# Patient Record
Sex: Male | Born: 1985
Health system: Southern US, Community
[De-identification: ages and names within clinical notes are randomized; demographics above are authoritative.]

---

## 2017-03-14 ENCOUNTER — Ambulatory Visit (HOSPITAL_COMMUNITY)
Admission: EM | Admit: 2017-03-14 | Discharge: 2017-03-14 | Disposition: A | Discharge status: Home / Self Care | Payer: Self-pay | Attending: Emergency Medicine | Admitting: Emergency Medicine

## 2017-03-14 ENCOUNTER — Encounter (HOSPITAL_COMMUNITY): Payer: Self-pay | Admitting: Family Medicine

## 2017-03-14 ENCOUNTER — Ambulatory Visit (INDEPENDENT_AMBULATORY_CARE_PROVIDER_SITE_OTHER): Payer: Self-pay

## 2017-03-14 DIAGNOSIS — S5002XA Contusion of left elbow, initial encounter: Secondary | ICD-10-CM

## 2017-03-14 MED ORDER — NAPROXEN 500 MG PO TABS: 500.0000 mg | ORAL_TABLET | Freq: Two times a day (BID) | ORAL | 0 refills | Status: DC

## 2017-03-14 NOTE — ED Provider Notes (Signed)
CSN: [id number]     Arrival date & time 03/14/17  1002 History   First MD Initiated Contact with Patient 03/14/17 1034     Chief Complaint  Patient presents with  . Arm Injury   (Consider location/radiation/quality/duration/timing/severity/associated sxs/prior Treatment) Larey Seat 2 sdays ago and landed on his lt elbow. States that he has not been able to flex or extend it fully. Denies any shoulder pain . Is able to lift arm to put shirt on. Denies any previous injury to elbow but has had a dislocation to the lt upper shoulder before. Denies any numbness or tingling to digits. Has not taken anything pta.        History reviewed. No pertinent past medical history. History reviewed. No pertinent surgical history. History reviewed. No pertinent family history. Social History  Substance Use Topics  . Smoking status: Never Smoker  . Smokeless tobacco: Never Used  . Alcohol use Not on file    Review of Systems  Constitutional: Negative.   Respiratory: Negative.   Cardiovascular: Negative.   Musculoskeletal: Positive for joint swelling.       Lt elbow pain and swelling around the joint.   Skin: Negative.   Neurological: Negative.     Allergies  Patient has no known allergies.  Home Medications   Prior to Admission medications   Medication Sig Start Date End Date Taking? Authorizing Provider  naproxen (NAPROSYN) 500 MG tablet Take 1 tablet (500 mg total) by mouth 2 (two) times daily. 03/14/17   Tobi Bastos, NP   Meds Ordered and Administered this Visit  Medications - No data to display  BP 133/60   Pulse 60   Temp 98.3 F (36.8 C)   Resp 18   SpO2 100%  No data found.   Physical Exam  Constitutional: He appears well-developed.  Cardiovascular: Normal rate and regular rhythm.   Pulmonary/Chest: Effort normal and breath sounds normal.  Musculoskeletal: He exhibits edema and tenderness.  Lt elbow pain with flexion and extension at the joint anterior . Strong pulse,  warm and pink,  Skin: Skin is warm. Capillary refill takes less than 2 seconds.    Urgent Care Course     Procedures (including critical care time)  Labs Review Labs Reviewed - No data to display  Imaging Review Dg Elbow Complete Left  Result Date: 03/14/2017 CLINICAL DATA:  Elbow injury with pain. EXAM: LEFT ELBOW - COMPLETE 3+ VIEW COMPARISON:  None. FINDINGS: There is no evidence of fracture, dislocation, or joint effusion. There is no evidence of arthropathy or other focal bone abnormality. Soft tissues are unremarkable. IMPRESSION: Negative. Electronically Signed   By: Kennith Center M.D.   On: 03/14/2017 10:56             MDM   1. Contusion of left elbow, initial encounter    Xray does not show a fracture. Offered to x ray shoulder and pt did not want to at this time. Expressed that he may need to have an MRI or further testing if area does not get better In 1 week Referred to ortho  Take meds as needed. May use ice and heat     Tobi Bastos, NP 03/14/17 1126

## 2017-03-14 NOTE — ED Triage Notes (Signed)
Pt here for left elbow pain after a fall on Tuesday.

## 2018-12-07 ENCOUNTER — Ambulatory Visit (INDEPENDENT_AMBULATORY_CARE_PROVIDER_SITE_OTHER): Payer: Self-pay | Admitting: Family Medicine

## 2018-12-07 ENCOUNTER — Encounter: Payer: Self-pay | Admitting: Family Medicine

## 2018-12-07 VITALS — BP 124/82 | HR 62 | Resp 17 | Ht 66.0 in | Wt 150.0 lb

## 2018-12-07 DIAGNOSIS — Z0000 Encounter for general adult medical examination without abnormal findings: Secondary | ICD-10-CM

## 2018-12-07 DIAGNOSIS — Z13220 Encounter for screening for lipoid disorders: Secondary | ICD-10-CM

## 2018-12-07 DIAGNOSIS — Z23 Encounter for immunization: Secondary | ICD-10-CM

## 2018-12-07 DIAGNOSIS — Z114 Encounter for screening for human immunodeficiency virus [HIV]: Secondary | ICD-10-CM

## 2018-12-07 DIAGNOSIS — Z131 Encounter for screening for diabetes mellitus: Secondary | ICD-10-CM

## 2018-12-07 DIAGNOSIS — R6882 Decreased libido: Secondary | ICD-10-CM

## 2018-12-07 DIAGNOSIS — R5383 Other fatigue: Secondary | ICD-10-CM

## 2018-12-07 DIAGNOSIS — Z7689 Persons encountering health services in other specified circumstances: Secondary | ICD-10-CM

## 2018-12-07 LAB — POCT URINALYSIS DIP (CLINITEK)
BILIRUBIN UA: NEGATIVE
GLUCOSE UA: NEGATIVE mg/dL
Ketones, POC UA: NEGATIVE mg/dL
Leukocytes, UA: NEGATIVE
Nitrite, UA: NEGATIVE
PH UA: 6 (ref 5.0–8.0)
POC PROTEIN,UA: NEGATIVE
RBC UA: NEGATIVE
SPEC GRAV UA: 1.02 (ref 1.010–1.025)
UROBILINOGEN UA: 0.2 U/dL

## 2018-12-07 NOTE — Progress Notes (Signed)
Patient ID: Manuel Barnes, male    DOB: 1986/05/20, 33 y.o.   MRN: [id number]  PCP: Bing Neighbors, FNP  Chief Complaint  Patient presents with  . Establish Care  . Hyperlipidemia    is physicallly active(plays soccer & works out at home). diet is mostly vegan/plant based. eats meat occasionally    Subjective:  HPI Manuel Barnes is a 33 y.o. male, nonsmoker presents for complete physical exam.  Concern that since he's turned 33 years old over the last 3 years he has become more "weaker" when playing sports or with physically pushing his endurance level. He also concerned that he has a testosterone deficiency as he has had difficulty maintaining erections occasionally.  He reports mentally being interested in having sex however during a couple episodes he has been unable to achieve an erection.  This concerns him greatly as he is worried that something more organic is the cause.  Is physically active.  He does not use alcohol, smoke, or use illicit drugs.  He endorses is a mostly plant-based vegan type diet she occasionally diverts from if these types of foods are not readily available.  He endorses stress related to work.  Medical history is significant for father and paternal uncle both with diabetes.  His mother has borderline diabetes however is not currently taking any medication managing through lifestyle changes.  Eyes any known family history of cardiovascular disease or lung disease and/or cancers.  Chronic conditions include: There are no active problems to display for this patient.   Current home medications include: Prior to Admission medications   Not on File    Health Promotion: Health Screening Current/Overdue:   Immunizations overdue for Tdap No routine eye or dental care.  Although he endorses he is supposed to wear corrective lenses.  Family History  Problem Relation Age of Onset  . Diabetes Father   . Hyperlipidemia Father   . Diabetes  Paternal Uncle   . Hyperlipidemia Paternal Uncle   . Diabetes Paternal Grandmother   . Hyperlipidemia Paternal Grandmother   . Diabetes Paternal Grandfather   . Hyperlipidemia Paternal Grandfather   . Stroke Neg Hx      No Known Allergies  Social History   Socioeconomic History  . Marital status: Single    Spouse name: Not on file  . Number of children: Not on file  . Years of education: Not on file  . Highest education level: Not on file  Occupational History  . Not on file  Social Needs  . Financial resource strain: Not on file  . Food insecurity:    Worry: Not on file    Inability: Not on file  . Transportation needs:    Medical: Not on file    Non-medical: Not on file  Tobacco Use  . Smoking status: Never Smoker  . Smokeless tobacco: Never Used  Substance and Sexual Activity  . Alcohol use: Not on file  . Drug use: Not on file  . Sexual activity: Not on file  Lifestyle  . Physical activity:    Days per week: Not on file    Minutes per session: Not on file  . Stress: Not on file  Relationships  . Social connections:    Talks on phone: Not on file    Gets together: Not on file    Attends religious service: Not on file    Active member of club or organization: Not on file    Attends meetings of clubs  or organizations: Not on file    Relationship status: Not on file  . Intimate partner violence:    Fear of current or ex partner: Not on file    Emotionally abused: Not on file    Physically abused: Not on file    Forced sexual activity: Not on file  Other Topics Concern  . Not on file  Social History Narrative  . Not on file   Review of Systems Pertinent negatives listed in HPI Past Medical, Surgical Family and Social History reviewed and updated.  Objective:   Today's Vitals   12/07/18 1112  BP: 124/82  Pulse: 62  Resp: 17  SpO2: 97%  Weight: 150 lb (68 kg)  Height: 5\' 6"  (1.676 m)    Wt Readings from Last 3 Encounters:  12/07/18 150 lb (68  kg)   Physical Exam            Assessment & Plan:  1. Encounter to establish care 2. Lipid screening - Lipid Panel  3. Fatigue, unspecified type Checking the following lab: - CBC with Differential - Iron, TIBC and Ferritin Panel - Thyroid Panel With TSH  4. Low libido - POCT URINALYSIS DIP (CLINITEK) - Testosterone Free, Profile I  5. Screening for diabetes mellitus - Comprehensive metabolic panel - Hemoglobin A1c  6. Screening for HIV (human immunodeficiency virus) - HIV antibody (with reflex)  7. Need for Tdap vaccination TDAP given   8. Annual physical exam Age-appropriate anticipatory guidance provided.    Orders Placed This Encounter  Procedures  . Tdap vaccine greater than or equal to 7yo IM  . Lipid Panel    Order Specific Question:   Has the patient fasted?    Answer:   Yes  . Testosterone Free, Profile I  . Comprehensive metabolic panel    Order Specific Question:   Has the patient fasted?    Answer:   Yes  . CBC with Differential  . Iron, TIBC and Ferritin Panel  . Hemoglobin A1c  . Thyroid Panel With TSH  . HIV antibody (with reflex)  . POCT URINALYSIS DIP (CLINITEK)   Joaquin CourtsKimberly Jhada Risk, FNP [facility] [address], [place] [id number] [phone]: [phone]

## 2018-12-07 NOTE — Patient Instructions (Addendum)
Thank you for choosing Primary Care at [facility] to be your medical home!    Manuel Barnes was seen by Molli Barrows, FNP today.   Margaretmary Dys Gonzalez's primary care provider is Scot Jun, FNP.   For the best care possible, you should try to see Molli Barrows, FNP-C whenever you come to the clinic.   We look forward to seeing you again soon!  If you have any questions about your visit today, please call us at [phone] or feel free to reach your primary care provider via [facility].     Preventive Care 18-39 Years, Male Preventive care refers to lifestyle choices and visits with your health care provider that can promote health and wellness. What does preventive care include?   A yearly physical exam. This is also called an annual well check.  Dental exams once or twice a year.  Routine eye exams. Ask your health care provider how often you should have your eyes checked.  Personal lifestyle choices, including: ? Daily care of your teeth and gums. ? Regular physical activity. ? Eating a healthy diet. ? Avoiding tobacco and drug use. ? Limiting alcohol use. ? Practicing safe sex. What happens during an annual well check? The services and screenings done by your health care provider during your annual well check will depend on your age, overall health, lifestyle risk factors, and family history of disease. Counseling Your health care provider may ask you questions about your:  Alcohol use.  Tobacco use.  Drug use.  Emotional well-being.  Home and relationship well-being.  Sexual activity.  Eating habits.  Work and work Statistician. Screening You may have the following tests or measurements:  Height, weight, and BMI.  Blood pressure.  Lipid and cholesterol levels. These may be checked every 5 years starting at age 71.  Diabetes screening. This is done by checking your blood sugar (glucose) after you have not eaten for a while  (fasting).  Skin check.  Hepatitis C blood test.  Hepatitis B blood test.  Sexually transmitted disease (STD) testing. Discuss your test results, treatment options, and if necessary, the need for more tests with your health care provider. Vaccines Your health care provider may recommend certain vaccines, such as:  Influenza vaccine. This is recommended every year.  Tetanus, diphtheria, and acellular pertussis (Tdap, Td) vaccine. You may need a Td booster every 10 years.  Varicella vaccine. You may need this if you have not been vaccinated.  HPV vaccine. If you are 34 or younger, you may need three doses over 6 months.  Measles, mumps, and rubella (MMR) vaccine. You may need at least one dose of MMR.You may also need a second dose.  Pneumococcal 13-valent conjugate (PCV13) vaccine. You may need this if you have certain conditions and have not been vaccinated.  Pneumococcal polysaccharide (PPSV23) vaccine. You may need one or two doses if you smoke cigarettes or if you have certain conditions.  Meningococcal vaccine. One dose is recommended if you are age 53-21 years and a first-year college student living in a residence hall, or if you have one of several medical conditions. You may also need additional booster doses.  Hepatitis A vaccine. You may need this if you have certain conditions or if you travel or work in places where you may be exposed to hepatitis A.  Hepatitis B vaccine. You may need this if you have certain conditions or if you travel or work in places where you may be exposed to  hepatitis B.  Haemophilus influenzae type b (Hib) vaccine. You may need this if you have certain risk factors. Talk to your health care provider about which screenings and vaccines you need and how often you need them. This information is not intended to replace advice given to you by your health care provider. Make sure you discuss any questions you have with your health care  provider. Document Released: 12/31/2001 Document Revised: 06/17/2017 Document Reviewed: 09/05/2015 Elsevier Interactive Patient Education  2019 [employer].    Erectile Dysfunction Erectile dysfunction (ED) is the inability to get or keep an erection in order to have sexual intercourse. Erectile dysfunction may include:  Inability to get an erection.  Lack of enough hardness of the erection to allow penetration.  Loss of the erection before sex is finished. What are the causes? This condition may be caused by:  Certain medicines, such as: ? Pain relievers. ? Antihistamines. ? Antidepressants. ? Blood pressure medicines. ? Water pills (diuretics). ? Ulcer medicines. ? Muscle relaxants. ? Drugs.  Excessive drinking.  Psychological causes, such as: ? Anxiety. ? Depression. ? Sadness. ? Exhaustion. ? Performance fear. ? Stress.  Physical causes, such as: ? Artery problems. This may include diabetes, smoking, liver disease, or atherosclerosis. ? High blood pressure. ? Hormonal problems, such as low testosterone. ? Obesity. ? Nerve problems. This may include back or pelvic injuries, diabetes mellitus, multiple sclerosis, or Parkinson disease. What are the signs or symptoms? Symptoms of this condition include:  Inability to get an erection.  Lack of enough hardness of the erection to allow penetration.  Loss of the erection before sex is finished.  Normal erections at some times, but with frequent unsatisfactory episodes.  Low sexual satisfaction in either partner due to erection problems.  A curved penis occurring with erection. The curve may cause pain or the penis may be too curved to allow for intercourse.  Never having nighttime erections. How is this diagnosed? This condition is often diagnosed by:  Performing a physical exam to find other diseases or specific problems with the penis.  Asking you detailed questions about the problem.  Performing  blood tests to check for diabetes mellitus or to measure hormone levels.  Performing other tests to check for underlying health conditions.  Performing an ultrasound exam to check for scarring.  Performing a test to check blood flow to the penis.  Doing a sleep study at home to measure nighttime erections. How is this treated? This condition may be treated by:  Medicine taken by mouth to help you achieve an erection (oral medicine).  Hormone replacement therapy to replace low testosterone levels.  Medicine that is injected into the penis. Your health care provider may instruct you how to give yourself these injections at home.  Vacuum pump. This is a pump with a ring on it. The pump and ring are placed on the penis and used to create pressure that helps the penis become erect.  Penile implant surgery. In this procedure, you may receive: ? An inflatable implant. This consists of cylinders, a pump, and a reservoir. The cylinders can be inflated with a fluid that helps to create an erection, and they can be deflated after intercourse. ? A semi-rigid implant. This consists of two silicone rubber rods. The rods provide some rigidity. They are also flexible, so the penis can both curve downward in its normal position and become straight for sexual intercourse.  Blood vessel surgery, to improve blood flow to the penis. During  this procedure, a blood vessel from a different part of the body is placed into the penis to allow blood to flow around (bypass) damaged or blocked blood vessels.  Lifestyle changes, such as exercising more, losing weight, and quitting smoking. Follow these instructions at home: Medicines   Take over-the-counter and prescription medicines only as told by your health care provider. Do not increase the dosage without first discussing it with your health care provider.  If you are using self-injections, perform injections as directed by your health care provider. Make  sure to avoid any veins that are on the surface of the penis. After giving an injection, apply pressure to the injection site for 5 minutes. General instructions  Exercise regularly, as directed by your health care provider. Work with your health care provider to lose weight, if needed.  Do not use any products that contain nicotine or tobacco, such as cigarettes and e-cigarettes. If you need help quitting, ask your health care provider.  Before using a vacuum pump, read the instructions that come with the pump and discuss any questions with your health care provider.  Keep all follow-up visits as told by your health care provider. This is important. Contact a health care provider if:  You feel nauseous.  You vomit. Get help right away if:  You are taking oral or injectable medicines and you have an erection that lasts longer than 4 hours. If your health care provider is unavailable, go to the nearest emergency room for evaluation. An erection that lasts much longer than 4 hours can result in permanent damage to your penis.  You have severe pain in your groin or abdomen.  You develop redness or severe swelling of your penis.  You have redness spreading up into your groin or lower abdomen.  You are unable to urinate.  You experience chest pain or a rapid heart beat (palpitations) after taking oral medicines. Summary  Erectile dysfunction (ED) is the inability to get or keep an erection during sexual intercourse. This problem can usually be treated successfully.  This condition is diagnosed based on a physical exam, your symptoms, and tests to determine the cause. Treatment varies depending on the cause, and may include medicines, hormone therapy, surgery, or vacuum pump.  You may need follow-up visits to make sure that you are using your medicines or devices correctly.  Get help right away if you are taking or injecting medicines and you have an erection that lasts longer than 4  hours. This information is not intended to replace advice given to you by your health care provider. Make sure you discuss any questions you have with your health care provider. Document Released: 11/01/2000 Document Revised: 11/20/2016 Document Reviewed: 11/20/2016 Elsevier Interactive Patient Education  2019 [employer].

## 2018-12-08 LAB — IRON,TIBC AND FERRITIN PANEL
FERRITIN: 85 ng/mL (ref 30–400)
Iron Saturation: 30 % (ref 15–55)
Iron: 95 ug/dL (ref 38–169)
TIBC: 318 ug/dL (ref 250–450)
UIBC: 223 ug/dL (ref 111–343)

## 2018-12-09 ENCOUNTER — Telehealth: Payer: Self-pay | Admitting: Family Medicine

## 2018-12-09 NOTE — Telephone Encounter (Signed)
Called [facility]. They did receive the specimens. The ferritin was on a separate requisition & that's why those results are in the system. The other labs have been resulted except for the Testosterone level. Requested that they fax the results that they have in the meantime.

## 2018-12-09 NOTE — Telephone Encounter (Signed)
This person's labs never resulted. Could you follow-up with [facility]?  Thanks,  Joaquin CourtsKimberly Dragan Tamburrino, FNP

## 2018-12-10 LAB — CBC WITH DIFFERENTIAL/PLATELET
BASOS ABS: 0.1 10*3/uL (ref 0.0–0.2)
Basos: 1 %
EOS (ABSOLUTE): 0.4 10*3/uL (ref 0.0–0.4)
Eos: 7 %
Hematocrit: 44.7 % (ref 37.5–51.0)
Hemoglobin: 15.6 g/dL (ref 13.0–17.7)
Immature Grans (Abs): 0 10*3/uL (ref 0.0–0.1)
Immature Granulocytes: 0 %
LYMPHS ABS: 1.3 10*3/uL (ref 0.7–3.1)
LYMPHS: 26 %
MCH: 31 pg (ref 26.6–33.0)
MCHC: 34.9 g/dL (ref 31.5–35.7)
MCV: 89 fL (ref 79–97)
MONOS ABS: 0.4 10*3/uL (ref 0.1–0.9)
Monocytes: 7 %
NEUTROS ABS: 3 10*3/uL (ref 1.4–7.0)
Neutrophils: 59 %
PLATELETS: 181 10*3/uL (ref 150–450)
RBC: 5.03 x10E6/uL (ref 4.14–5.80)
RDW: 12.5 % (ref 11.6–15.4)
WBC: 5.1 10*3/uL (ref 3.4–10.8)

## 2018-12-10 LAB — HIV ANTIBODY (ROUTINE TESTING W REFLEX): HIV SCREEN 4TH GENERATION: NONREACTIVE

## 2018-12-10 LAB — HEMOGLOBIN A1C
Est. average glucose Bld gHb Est-mCnc: 100 mg/dL
HEMOGLOBIN A1C: 5.1 % (ref 4.8–5.6)

## 2018-12-10 LAB — COMPREHENSIVE METABOLIC PANEL
A/G RATIO: 2.3 — AB (ref 1.2–2.2)
ALT: 11 IU/L (ref 0–44)
AST: 8 IU/L (ref 0–40)
Albumin: 4.9 g/dL (ref 4.0–5.0)
Alkaline Phosphatase: 64 IU/L (ref 39–117)
BILIRUBIN TOTAL: 0.5 mg/dL (ref 0.0–1.2)
BUN/Creatinine Ratio: 19 (ref 9–20)
BUN: 16 mg/dL (ref 6–20)
CHLORIDE: 106 mmol/L (ref 96–106)
CO2: 23 mmol/L (ref 20–29)
Calcium: 10 mg/dL (ref 8.7–10.2)
Creatinine, Ser: 0.86 mg/dL (ref 0.76–1.27)
GFR calc non Af Amer: 114 mL/min/{1.73_m2} (ref >59)
GFR, EST AFRICAN AMERICAN: 132 mL/min/{1.73_m2} (ref >59)
GLUCOSE: 95 mg/dL (ref 65–99)
Globulin, Total: 2.1 g/dL (ref 1.5–4.5)
POTASSIUM: 5.4 mmol/L — AB (ref 3.5–5.2)
SODIUM: 144 mmol/L (ref 134–144)
TOTAL PROTEIN: 7 g/dL (ref 6.0–8.5)

## 2018-12-10 LAB — TESTOSTERONE FREE, PROFILE I
Sex Hormone Binding: 68.1 nmol/L — ABNORMAL HIGH (ref 16.5–55.9)
TESTOSTERONE: 724 ng/dL (ref 264–916)
Testost., Free, Calc: 92.7 pg/mL (ref 42.3–190.0)

## 2018-12-10 LAB — LIPID PANEL
CHOL/HDL RATIO: 2.7 ratio (ref 0.0–5.0)
Cholesterol, Total: 183 mg/dL (ref 100–199)
HDL: 68 mg/dL (ref >39)
LDL CALC: 102 mg/dL — AB (ref 0–99)
Triglycerides: 65 mg/dL (ref 0–149)
VLDL CHOLESTEROL CAL: 13 mg/dL (ref 5–40)

## 2018-12-10 LAB — THYROID PANEL WITH TSH
Free Thyroxine Index: 1.9 (ref 1.2–4.9)
T3 Uptake Ratio: 28 % (ref 24–39)
T4, Total: 6.7 ug/dL (ref 4.5–12.0)
TSH: 2.57 u[IU]/mL (ref 0.450–4.500)

## 2018-12-16 NOTE — Progress Notes (Signed)
Patient notified of results & recommendations. Expressed understanding. Will call back to make a lab appointment once he knows what his work schedule is like.

## 2019-04-22 ENCOUNTER — Other Ambulatory Visit: Payer: Self-pay

## 2019-04-22 ENCOUNTER — Ambulatory Visit (INDEPENDENT_AMBULATORY_CARE_PROVIDER_SITE_OTHER): Payer: Self-pay | Admitting: Family Medicine

## 2019-04-22 ENCOUNTER — Encounter: Payer: Self-pay | Admitting: Family Medicine

## 2019-04-22 DIAGNOSIS — Z7689 Persons encountering health services in other specified circumstances: Secondary | ICD-10-CM

## 2019-04-22 DIAGNOSIS — R6882 Decreased libido: Secondary | ICD-10-CM

## 2019-04-22 DIAGNOSIS — R3989 Other symptoms and signs involving the genitourinary system: Secondary | ICD-10-CM

## 2019-04-22 NOTE — Progress Notes (Signed)
Virtual Visit via Telephone Note  I connected with Manuel Barnes on 04/22/19 at  2:50 PM EDT by telephone and verified that I am speaking with the correct person using two identifiers.  Location: Patient: Located at home during today's encounter  Provider: Located at primary care office    I discussed the limitations, risks, security and privacy concerns of performing an evaluation and management service by telephone and the availability of in person appointments. I also discussed with the patient that there may be a patient responsible charge related to this service. The patient expressed understanding and agreed to proceed.  History of Present Illness: Manuel Barnes is requesting a referral to urology. During his last visit he was concern that his libido had decreased compared to a couple year prior. He is with the same sexual partner. During the last office visit, a testosterone level was measured and did not reveal low testosterone. Over the last month he reports a few occasions experiencing difficulty achieving and maintaining intercourse longevity. He also reports that in the past he was able to hold his urine and now this is almost impossible without producing pain. He experiences pain when he attempts to stop urine mid-stream. Denies any new sexual partners or concern for STD. No urine odor or dysuria. Assessment and Plan: 1. Referral of patient -Urology referral placed.  2. Low libido 3. Urine troubles -Urology referral placed.   Follow Up Instructions: As needed.   I discussed the assessment and treatment plan with the patient. The patient was provided an opportunity to ask questions and all were answered. The patient agreed with the plan and demonstrated an understanding of the instructions.   The patient was advised to call back or seek an in-person evaluation if the symptoms worsen or if the condition fails to improve as anticipated.  I provided 15 minutes  of non-face-to-face time during this encounter.   Manuel Courts, FNP

## 2019-04-22 NOTE — Progress Notes (Deleted)
Called patient to initiate their telephone visit with provider Joaquin Courts, FNP-C. Verified date of birth. Wants to be referred to Urology. Would not go into more detail. KWalker, CMA.

## 2019-05-25 ENCOUNTER — Encounter: Payer: Self-pay | Admitting: Family Medicine

## 2019-05-25 NOTE — Addendum Note (Signed)
Addended by: Scot Jun on: 05/25/2019 03:36 PM   Modules accepted: Orders

## 2019-05-25 NOTE — Progress Notes (Signed)
Reordered urology referral for patient.

## 2020-08-06 DIAGNOSIS — M25569 Pain in unspecified knee: Secondary | ICD-10-CM | POA: Insufficient documentation

## 2020-09-22 DIAGNOSIS — Z31440 Encounter of male for testing for genetic disease carrier status for procreative management: Secondary | ICD-10-CM | POA: Diagnosis not present

## 2021-06-05 NOTE — Progress Notes (Signed)
[name] [address] [place] [address] Phone: [phone] Subjective:   I Manuel Barnes am serving as a Neurosurgeon for Dr. Antoine Primas.  This visit occurred during the SARS-CoV-2 public health emergency.  Safety protocols were in place, including screening questions prior to the visit, additional usage of staff PPE, and extensive cleaning of exam room while observing appropriate contact time as indicated for disinfecting solutions.   I'm seeing this patient by the request  of:  Bing Neighbors, FNP  CC: Knee pain  [id number]  Manuel Barnes is a 35 y.o. male coming in with complaint of right knee pain. Patient states last September he was playing soccer. Patient states there was a awkward load on the knee. Couldn't walk for about a week. Was trying to strengthen and started back in April. States the knee felt odd. Sitting is painful. Knee feels unstable and weak. Pain is on the medial side. Sometimes he feels calf and ankle pain. Tried heat and ice for a while. States it does not help anymore. 5/10. States he also has difficulty kicking.        No past medical history on file. No past surgical history on file. Social History   Socioeconomic History   Marital status: Married    Spouse name: Not on file   Number of children: Not on file   Years of education: Not on file   Highest education level: Not on file  Occupational History   Not on file  Tobacco Use   Smoking status: Never   Smokeless tobacco: Never  Substance and Sexual Activity   Alcohol use: Not on file   Drug use: Not on file   Sexual activity: Not on file  Other Topics Concern   Not on file  Social History Narrative   Not on file   Social Determinants of Health   Financial Resource Strain: Not on file  Food Insecurity: Not on file  Transportation Needs: Not on file  Physical Activity: Not on file  Stress: Not on file  Social Connections: Not on file    No Known Allergies Family History  Problem Relation Age of Onset   Diabetes Father    Hyperlipidemia Father    Diabetes Paternal Uncle    Hyperlipidemia Paternal Uncle    Diabetes Paternal Grandmother    Hyperlipidemia Paternal Grandmother    Diabetes Paternal Grandfather    Hyperlipidemia Paternal Grandfather    Stroke Neg Hx    No current outpatient medications on file.   Reviewed prior external information including notes and imaging from  primary care provider As well as notes that were available from care everywhere and other healthcare systems.  Past medical history, social, surgical and family history all reviewed in electronic medical record.  No pertanent information unless stated regarding to the chief complaint.   Review of Systems:  No headache, visual changes, nausea, vomiting, diarrhea, constipation, dizziness, abdominal pain, skin rash, fevers, chills, night sweats, weight loss, swollen lymph nodes, body aches, joint swelling, chest pain, shortness of breath, mood changes. POSITIVE muscle aches  Objective  Blood pressure 120/78, pulse 69, height 5\' 6"  (1.676 m), weight 151 lb (68.5 kg), SpO2 99 %.   General: No apparent distress alert and oriented x3 mood and affect normal, dressed appropriately.  HEENT: Pupils equal, extraocular movements intact  Respiratory: Patient's speak in full sentences and does not appear short of breath  Cardiovascular: No lower extremity edema, non tender, no  erythema  Knee exam shows right knee exam shows the patient does have unfortunately pain over the medial aspect.  Positive McMurray's noted.  Patient otherwise in good stability with valgus force and varus force. Low back exam shows the patient does have some loss of lordosis.  Significant tightness noted of the psoas muscles bilaterally.  Patient does have tightness with FABER test.  Neurovascular intact distally.  Negative straight leg test.  Limited muscular skeletal ultrasound  was performed and interpreted by Antoine Primas, M  Limited ultrasound of patient's right knee shows that there is some degenerative changes noted with possible acute on chronic tear of the medial meniscus.  Patient has good articular cartilage of the patellofemoral joint.  Otherwise fairly unremarkable. Impression: Questionable medial meniscal tear nondisplaced  Osteopathic findings T8 extended rotated and side bent left L1 flexed rotated and side bent right L3 flexed rotated and side bent left  sacrum right on right   97110; 15 additional minutes spent for Therapeutic exercises as stated in above notes.  This included exercises focusing on stretching, strengthening, with significant focus on eccentric aspects.   Long term goals include an improvement in range of motion, strength, endurance as well as avoiding reinjury. Patient's frequency would include in 1-2 times a day, 3-5 times a week for a duration of 6-12 weeks. Low back exercises that included:  Pelvic tilt/bracing instruction to focus on control of the pelvic girdle and lower abdominal muscles  Glute strengthening exercises, focusing on proper firing of the glutes without engaging the low back muscles Proper stretching techniques for maximum relief for the hamstrings, hip flexors, low back and some rotation where tolerated  Proper technique shown and discussed handout in great detail with ATC.  All questions were discussed and answered.   Impression and Recommendations:     The above documentation has been reviewed and is accurate and complete Judi Saa, DO

## 2021-06-06 ENCOUNTER — Other Ambulatory Visit: Payer: Self-pay

## 2021-06-06 ENCOUNTER — Ambulatory Visit: Payer: Self-pay

## 2021-06-06 ENCOUNTER — Encounter: Payer: Self-pay | Admitting: Family Medicine

## 2021-06-06 ENCOUNTER — Ambulatory Visit (INDEPENDENT_AMBULATORY_CARE_PROVIDER_SITE_OTHER): Payer: 59 | Admitting: Family Medicine

## 2021-06-06 ENCOUNTER — Ambulatory Visit (INDEPENDENT_AMBULATORY_CARE_PROVIDER_SITE_OTHER): Payer: 59

## 2021-06-06 VITALS — BP 120/78 | HR 69 | Ht 66.0 in | Wt 151.0 lb

## 2021-06-06 DIAGNOSIS — M9903 Segmental and somatic dysfunction of lumbar region: Secondary | ICD-10-CM | POA: Diagnosis not present

## 2021-06-06 DIAGNOSIS — G8929 Other chronic pain: Secondary | ICD-10-CM

## 2021-06-06 DIAGNOSIS — M545 Low back pain, unspecified: Secondary | ICD-10-CM

## 2021-06-06 DIAGNOSIS — M25561 Pain in right knee: Secondary | ICD-10-CM

## 2021-06-06 DIAGNOSIS — M9904 Segmental and somatic dysfunction of sacral region: Secondary | ICD-10-CM | POA: Diagnosis not present

## 2021-06-06 DIAGNOSIS — M549 Dorsalgia, unspecified: Secondary | ICD-10-CM | POA: Insufficient documentation

## 2021-06-06 NOTE — Assessment & Plan Note (Signed)
On ultrasound no significant swelling but does have what appears to be more of a medial meniscal injury.  Does not seem to be displaced.  We will try home exercises and icing regimen.  Patient did have the injury about nearly 10 months ago.  At this point if any worsening pain or increasing instability I do feel an MRI will be necessary.  Patient is in agreement with the plan and will follow up again in 4 to 6 weeks

## 2021-06-06 NOTE — Assessment & Plan Note (Signed)
   Decision today to treat with OMT was based on Physical Exam  After verbal consent patient was treated with HVLA, ME, FPR techniques in  thoracic, lumbar and sacral areas, all areas are chronic   Patient tolerated the procedure well with improvement in symptoms  Patient given exercises stretches and lifestyle modifications  See medications in patient instructions if given  Patient will follow up in 4-8 weeks

## 2021-06-06 NOTE — Assessment & Plan Note (Signed)
Back pain seems to be multifactorial.  Does have tightness of the hip flexors bilaterally.  We will get x-rays to further evaluate.  Responding well to manipulation.  Discussed posture and ergonomics and patient work with Event organiser.  Follow-up again in 6 to 8 weeks.

## 2021-06-06 NOTE — Patient Instructions (Addendum)
Good to see you Xray on the knee and back Hip flexor and knee exercises Try the pennsaid on the knee Ice 20 mins 2 times a day  See me again in 4-6 weeks

## 2021-06-27 DIAGNOSIS — Z23 Encounter for immunization: Secondary | ICD-10-CM | POA: Diagnosis not present

## 2021-06-27 DIAGNOSIS — Z0000 Encounter for general adult medical examination without abnormal findings: Secondary | ICD-10-CM | POA: Diagnosis not present

## 2021-07-06 ENCOUNTER — Other Ambulatory Visit: Payer: Self-pay

## 2021-07-06 ENCOUNTER — Ambulatory Visit (INDEPENDENT_AMBULATORY_CARE_PROVIDER_SITE_OTHER): Payer: 59 | Admitting: Family Medicine

## 2021-07-06 VITALS — BP 120/78 | HR 77 | Ht 66.0 in | Wt 160.0 lb

## 2021-07-06 DIAGNOSIS — G8929 Other chronic pain: Secondary | ICD-10-CM | POA: Diagnosis not present

## 2021-07-06 DIAGNOSIS — M25561 Pain in right knee: Secondary | ICD-10-CM

## 2021-07-06 DIAGNOSIS — M545 Low back pain, unspecified: Secondary | ICD-10-CM

## 2021-07-06 NOTE — Patient Instructions (Addendum)
Good to see you  MRI's ordered [phone] is the number to call and schedule  Will write you in mychart when results are in with next steps

## 2021-07-06 NOTE — Assessment & Plan Note (Signed)
Patient continues to have back pain.  Back pain seems to be out of proportion noted today.  X-rays are completely unremarkable.  Patient is unable to sleep more than 4 hours and has to continue to switch positions.  Patient states some mild pain already seems to be because difficulty.  Has been going on for years and seems to be worsening.  Starting to affect his job performance as well.  Patient does get some similar symptoms associated with his family and I do feel that advanced imaging could be warranted at this time.  MRI of the lumbar spine ordered.  Patient will follow-up afterwards and then discuss further.

## 2021-07-06 NOTE — Progress Notes (Signed)
[name] [address] [place] [address] Phone: [phone] Subjective:   Manuel Barnes, am serving as a scribe for Dr. Antoine Primas.  I'm seeing this patient by the request  of:  Bing Neighbors, FNP  CC: Back pain follow-up, knee pain follow-up  [id number]  Manuel Barnes is a 35 y.o. male coming in with complaint of back and neck pain as well as knee pain.  Knee exam and ultrasound did show a questionable medial meniscal tear that seem to be nondisplaced.  Back exam seem to be more multifactorial and secondary to muscle imbalances.  Attempted osteopathic manipulation.  Patient states that after the last OMT he hurt for three days after. Patient still having some pain and what feels like bones rubbing together on the lower left side especially when laying down and lifting that left leg. Patient can only sleep about 5 hours a night. Patient states that the knee is still similar as last visit, patient was doing the exercises for about two weeks but the knee started hurting to the point it was hurting to walk so he stopped. This week patient states that the change in weather caused sharp pain in Medial right knee to where he had to sit down till the pain subsided.         Patient did have x-rays of the lumbar spine at last follow-up that were independently visualized by me today showing no significant bony abnormality of the lumbar spine X-rays of the right knee were also done and showed no acute bony abnormality.   No past medical history on file.  No Known Allergies   Review of Systems:  No headache, visual changes, nausea, vomiting, diarrhea, constipation, dizziness, abdominal pain, skin rash, fevers, chills, night sweats, weight loss, swollen lymph nodes, body aches, joint swelling, chest pain, shortness of breath, mood changes. POSITIVE muscle aches  Objective  Blood pressure 120/78, pulse 77, height 5\' 6"  (1.676 m),  weight 160 lb (72.6 kg), SpO2 98 %.   General: No apparent distress alert and oriented x3 mood and affect normal, dressed appropriately.  HEENT: Pupils equal, extraocular movements intact  Respiratory: Patient's speak in full sentences and does not appear short of breath  Cardiovascular: No lower extremity edema, non tender, no erythema  Knee exam shows patient continues to have a positive McMurray's.  Patient lacks last few degrees of extension of the knee.  Lacks the last 10 degrees of flexion as well.  States that it is fairly severe. Back exam shows loss of lordosis.  Patient continuing to changes position when he is sitting.  Tightness noted with straight leg test.  Pain is out of proportion to the amount of palpation.       Assessment and Plan:  Back pain Patient continues to have back pain.  Back pain seems to be out of proportion noted today.  X-rays are completely unremarkable.  Patient is unable to sleep more than 4 hours and has to continue to switch positions.  Patient states some mild pain already seems to be because difficulty.  Has been going on for years and seems to be worsening.  Starting to affect his job performance as well.  Patient does get some similar symptoms associated with his family and I do feel that advanced imaging could be warranted at this time.  MRI of the lumbar spine ordered.  Patient will follow-up afterwards and then discuss further.  Knee pain Right knee pain.  Patient states that it is fairly severe.  Patient states that he gets catching him from time to time.  Feels like it is worsening as well making improvement.  He would like to consider the possibility of a pelvic imaging and would probably have surgical intervention if necessary.   Nonallopathic problems  Decision today to treat with OMT was based on Physical Exam  After verbal consent patient was treated with HVLA, ME, FPR techniques in cervical, rib, thoracic, lumbar, and sacral   areas  Patient tolerated the procedure well with improvement in symptoms  Patient given exercises, stretches and lifestyle modifications  See medications in patient instructions if given  Patient will follow up in 4-8 weeks      The above documentation has been reviewed and is accurate and complete Judi Saa, DO       Note: This dictation was prepared with Dragon dictation along with smaller phrase technology. Any transcriptional errors that result from this process are unintentional.

## 2021-07-06 NOTE — Assessment & Plan Note (Signed)
Right knee pain.  Patient states that it is fairly severe.  Patient states that he gets catching him from time to time.  Feels like it is worsening as well making improvement.  He would like to consider the possibility of a pelvic imaging and would probably have surgical intervention if necessary.

## 2021-07-26 ENCOUNTER — Ambulatory Visit
Admission: RE | Admit: 2021-07-26 | Discharge: 2021-07-26 | Disposition: A | Discharge status: Home / Self Care | Payer: 59 | Source: Ambulatory Visit | Attending: Family Medicine | Admitting: Family Medicine

## 2021-07-26 ENCOUNTER — Other Ambulatory Visit: Payer: Self-pay

## 2021-07-26 DIAGNOSIS — R600 Localized edema: Secondary | ICD-10-CM | POA: Diagnosis not present

## 2021-07-26 DIAGNOSIS — M25561 Pain in right knee: Secondary | ICD-10-CM | POA: Diagnosis not present

## 2021-07-26 DIAGNOSIS — G8929 Other chronic pain: Secondary | ICD-10-CM

## 2021-07-26 DIAGNOSIS — M5127 Other intervertebral disc displacement, lumbosacral region: Secondary | ICD-10-CM | POA: Diagnosis not present

## 2021-07-26 DIAGNOSIS — M48061 Spinal stenosis, lumbar region without neurogenic claudication: Secondary | ICD-10-CM | POA: Diagnosis not present

## 2021-07-27 ENCOUNTER — Encounter: Payer: Self-pay | Admitting: Family Medicine

## 2021-07-30 ENCOUNTER — Other Ambulatory Visit: Payer: Self-pay

## 2021-07-30 ENCOUNTER — Telehealth: Payer: Self-pay | Admitting: Family Medicine

## 2021-07-30 DIAGNOSIS — M5416 Radiculopathy, lumbar region: Secondary | ICD-10-CM

## 2021-07-30 NOTE — Telephone Encounter (Signed)
Patient called asking for more details on his Knee MRI.  Anything else we can add?

## 2021-08-10 ENCOUNTER — Inpatient Hospital Stay: Admission: RE | Admit: 2021-08-10 | Payer: 59 | Source: Ambulatory Visit

## 2021-08-17 ENCOUNTER — Ambulatory Visit
Admission: RE | Admit: 2021-08-17 | Discharge: 2021-08-17 | Disposition: A | Discharge status: Home / Self Care | Payer: 59 | Source: Ambulatory Visit | Attending: Family Medicine | Admitting: Family Medicine

## 2021-08-17 ENCOUNTER — Other Ambulatory Visit: Payer: Self-pay

## 2021-08-17 ENCOUNTER — Other Ambulatory Visit: Payer: Self-pay | Admitting: Family Medicine

## 2021-08-17 DIAGNOSIS — M5416 Radiculopathy, lumbar region: Secondary | ICD-10-CM

## 2021-08-17 DIAGNOSIS — M4726 Other spondylosis with radiculopathy, lumbar region: Secondary | ICD-10-CM | POA: Diagnosis not present

## 2021-08-17 MED ORDER — IOPAMIDOL (ISOVUE-M 200) INJECTION 41%
1.0000 mL | Freq: Once | INTRAMUSCULAR | Status: AC
  Administered 2021-08-17: 1 mL via EPIDURAL

## 2021-08-17 MED ORDER — METHYLPREDNISOLONE ACETATE 40 MG/ML INJ SUSP (RADIOLOG
80.0000 mg | Freq: Once | INTRAMUSCULAR | Status: AC
  Administered 2021-08-17: 80 mg via EPIDURAL

## 2021-08-17 NOTE — Discharge Instructions (Signed)

## 2021-08-21 ENCOUNTER — Other Ambulatory Visit: Payer: Self-pay

## 2021-08-21 DIAGNOSIS — M25561 Pain in right knee: Secondary | ICD-10-CM

## 2021-09-11 NOTE — Progress Notes (Signed)
[name] [address] [place] [address] Phone: [phone] Subjective:   Manuel Barnes, am serving as a scribe for Dr. Antoine Primas.  This visit occurred during the SARS-CoV-2 public health emergency.  Safety protocols were in place, including screening questions prior to the visit, additional usage of staff PPE, and extensive cleaning of exam room while observing appropriate contact time as indicated for disinfecting solutions.    I'm seeing this patient by the request  of:  Bing Neighbors, FNP  CC: Knee and low back pain follow-up  [id number]  07/06/2021 Right knee pain.  Patient states that it is fairly severe.  Patient states that he gets catching him from time to time.  Feels like it is worsening as well making improvement.  He would like to consider the possibility of a pelvic imaging and would probably have surgical intervention if necessary. Patient continues to have back pain.  Back pain seems to be out of proportion noted today.  X-rays are completely unremarkable.  Patient is unable to sleep more than 4 hours and has to continue to switch positions.  Patient states some mild pain already seems to be because difficulty.  Has been going on for years and seems to be worsening.  Starting to affect his job performance as well.  Patient does get some similar symptoms associated with his family and I do feel that advanced imaging could be warranted at this time.  MRI of the lumbar spine ordered.  Patient will follow-up afterwards and then discuss further.  Updated 09/13/2021 Manuel Barnes is a 35 y.o. male coming in with complaint of B knee R>L. Noticed pain is worse in knees with weather changes. Pain over medial aspect.   Epidural on 08/17/2021. Patient feels like it did not help. Has pain with lying in bed and turning over. Pain is back is also affected by cold weather.   MRI IMPRESSION: 1. No medial meniscal tear is  identified. 2. Accentuated signal in the distal ACL may reflect degeneration or mild sprain, but no overt tear is identified. 3. Subtle focal chondral edema along the medial patellar facet, without overt chondral defect or thinning. 4. Mild focal synovitis just above the patella.  No knee effusion.  IMPRESSION: 1. Small right foraminal disc protrusion at L5-S1, contacting and potentially irritating the exiting right L5 nerve root. 2. Additional shallow central disc protrusion at L5-S1 without impingement. 3. Mild bilateral L4 and L5 foraminal stenosis related to disc bulging and facet hypertrophy.  Patient did undergo an epidural at L5-S1 on September 30.     No past medical history on file. No past surgical history on file. Social History   Socioeconomic History   Marital status: Married    Spouse name: Not on file   Number of children: Not on file   Years of education: Not on file   Highest education level: Not on file  Occupational History   Not on file  Tobacco Use   Smoking status: Never   Smokeless tobacco: Never  Substance and Sexual Activity   Alcohol use: Not on file   Drug use: Not on file   Sexual activity: Not on file  Other Topics Concern   Not on file  Social History Narrative   Not on file   Social Determinants of Health   Financial Resource Strain: Not on file  Food Insecurity: Not on file  Transportation Needs: Not on file  Physical Activity: Not on  file  Stress: Not on file  Social Connections: Not on file   No Known Allergies Family History  Problem Relation Age of Onset   Diabetes Father    Hyperlipidemia Father    Diabetes Paternal Uncle    Hyperlipidemia Paternal Uncle    Diabetes Paternal Grandmother    Hyperlipidemia Paternal Grandmother    Diabetes Paternal Grandfather    Hyperlipidemia Paternal Grandfather    Stroke Neg Hx    No current outpatient medications on file.   Reviewed prior external information including notes  and imaging from  primary care provider As well as notes that were available from care everywhere and other healthcare systems.  This includes previous imaging we discussed.  Past medical history, social, surgical and family history all reviewed in electronic medical record.  No pertanent information unless stated regarding to the chief complaint.   Review of Systems:  No headache, visual changes, nausea, vomiting, diarrhea, constipation, dizziness, abdominal pain, skin rash, fevers, chills, night sweats, weight loss, swollen lymph nodes, body aches, joint swelling, chest pain, shortness of breath, mood changes. POSITIVE muscle aches  Objective  Blood pressure 122/82, pulse 69, height 5\' 6"  (1.676 m), weight 164 lb (74.4 kg), SpO2 98 %.   General: No apparent distress alert and oriented x3 mood and affect normal, dressed appropriately.  HEENT: Pupils equal, extraocular movements intact  Respiratory: Patient's speak in full sentences and does not appear short of breath  Cardiovascular: No lower extremity edema, non tender, no erythema  Gait normal with good balance and coordination.  MSK: Right knee exam still has a positive McMurray's noted.  Tender to palpation over the medial joint line.   After informed written and verbal consent, patient was seated on exam table. Right knee was prepped with alcohol swab and utilizing anterolateral approach, patient's right knee space was injected with 4:1  marcaine 0.5%: Kenalog 40mg /dL. Patient tolerated the procedure well without immediate complications.     Impression and Recommendations:     The above documentation has been reviewed and is accurate and complete , DO

## 2021-09-13 ENCOUNTER — Encounter: Payer: Self-pay | Admitting: Family Medicine

## 2021-09-13 ENCOUNTER — Other Ambulatory Visit: Payer: Self-pay

## 2021-09-13 ENCOUNTER — Ambulatory Visit (INDEPENDENT_AMBULATORY_CARE_PROVIDER_SITE_OTHER): Payer: 59 | Admitting: Family Medicine

## 2021-09-13 DIAGNOSIS — M25561 Pain in right knee: Secondary | ICD-10-CM | POA: Diagnosis not present

## 2021-09-13 DIAGNOSIS — M545 Low back pain, unspecified: Secondary | ICD-10-CM

## 2021-09-13 DIAGNOSIS — G8929 Other chronic pain: Secondary | ICD-10-CM | POA: Diagnosis not present

## 2021-09-13 NOTE — Patient Instructions (Signed)
See me in 6-8 weeks Will get approval for gel  Do start PT

## 2021-09-13 NOTE — Assessment & Plan Note (Signed)
Patient's MRI of the knee showed that patient did have some mild degenerative mild sprain patient does have what appears to be some very subtle focal chondral edema noted and mild synovitis.  Injection given today to further evaluate and see how patient responds.  We will get approval for the chondral edema such as the viscosupplementation could be helpful.  Did discuss potential PRP but do not see anything that would need any surgical intervention.  Patient is agreement with the plan and will follow up with me again in 4 to 8 weeks.

## 2021-09-13 NOTE — Assessment & Plan Note (Signed)
Back exam and does want an MRI have been unfortunately more for inguinal stenosis then anticipated as well as a right L5 nerve root.  Discussed with patient that we could potentially consider nerve root injection instead of an epidural.  At this point though patient would like to see how the knee responds to the injection.  Depending on this patient will follow-up with me afterwards.  Encourage patient to do physical therapy which he is scheduled for tomorrow.

## 2021-09-14 ENCOUNTER — Ambulatory Visit: Payer: 59 | Attending: Family Medicine

## 2021-09-14 DIAGNOSIS — M25561 Pain in right knee: Secondary | ICD-10-CM | POA: Insufficient documentation

## 2021-09-14 DIAGNOSIS — M545 Low back pain, unspecified: Secondary | ICD-10-CM

## 2021-09-14 DIAGNOSIS — G8929 Other chronic pain: Secondary | ICD-10-CM

## 2021-09-14 DIAGNOSIS — R262 Difficulty in walking, not elsewhere classified: Secondary | ICD-10-CM

## 2021-09-14 NOTE — Patient Instructions (Signed)
Access Code: [email or web address] URL: [email or web address] Date: 09/14/2021 Prepared by: Mikey Kirschner  Exercises Standing Hamstring Stretch on Chair - 2 x daily - 7 x weekly - 1 sets - 3 reps - 30 sec hold Standing Quad Stretch with Table and Chair Support - 2 x daily - 7 x weekly - 1 sets - 3 reps - 30 sec hold

## 2021-09-14 NOTE — Therapy (Signed)
[facility] [address] [place], [place], [address] Phone: [phone]   Fax:  [phone]  Physical Therapy Evaluation  Patient Details  Name: Manuel Barnes MRN: [id number] Date of Birth: Oct 16, 1986 Referring Provider (PT): Antoine Primas, DO   Encounter Date: 09/14/2021   PT End of Session - 09/14/21 0826     Visit Number 1    Date for PT Re-Evaluation 10/12/21    Authorization Type UMR    PT Start Time 0801    PT Stop Time 0910    PT Time Calculation (min) 69 min    Activity Tolerance Patient tolerated treatment well    Behavior During Therapy [facility] for tasks assessed/performed             History reviewed. No pertinent past medical history.  History reviewed. No pertinent surgical history.  There were no vitals filed for this visit.    Subjective Assessment - 09/14/21 0806     Subjective Patient states he has been having back pain and right knee pain for quite some time.  The knee and back MRI's are noted in MD notes.  Lumbar bulging discs and facet irritation at L4-S1,  Right knee MRI non specific sprain and Degenerative changes. The knee issues seem to be consistent with medial meniscus and medial collateral ligament involvement.   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  He states the last time he played soccer was February and he stopped because the knee was so bothersome that he was compensating and it was bothering the left ankle.  He has been limited to walking with his wife.  They do trail walking and level surface walking.  He has a newborn as well.  No stairs in the home.  He would like to be able to be active and exercise as well as be able to do his work without his knee interfering and becoming painful.    Limitations Standing;Lifting    How long can you sit comfortably? unlimited    How long can you stand comfortably? 30 min    How long can you walk comfortably?  unlimted unless I feel that sharp pain    Diagnostic tests MRI and [place] of right knee: patient states he was told medial meniscus but report is not visible yet.  MD notes state "sprain" and degenerative chondral changes.  Lumbar MRI states L4-S1 disc bulges and facet irritation.    Patient Stated Goals To be able to exercise and be active as well as do my job without having to compensate and worry about exacerbations of pain.  He explains that he really doesn't enjoy soccer that much, but his friends are always asking him to play.    Currently in Pain? Yes    Pain Score 2     Pain Location Knee    Pain Orientation Right    Pain Descriptors / Indicators Discomfort    Pain Type Chronic pain    Pain Onset More than a month ago    Pain Frequency Several days a week    Aggravating Factors  Twisting motions or anything that puts stress on the medial aspect of the knee.  For his back, repetitive lifting.    Pain Relieving Factors Ice and heat.  Patient states he does not like to take any medications.    Effect of Pain on Daily Activities Interferes with higher level sports and exercise, unable to do his job  without altering his position to account for the knee and back    Multiple Pain Sites Yes    Pain Score 0    Pain Location Back    Pain Orientation Lower    Pain Descriptors / Indicators Aching;Tightness    Pain Type Chronic pain    Pain Onset More than a month ago    Pain Frequency Intermittent    Aggravating Factors  lifting    Pain Relieving Factors avoiding lifting and rest    Effect of Pain on Daily Activities Interferes with work                [facility] Assessment - 09/14/21 0001       Assessment   Medical Diagnosis Chronic right knee pain: Low back pain without sciatica    Referring Provider (PT) Antoine Primas, DO    Onset Date/Surgical Date 12/19/20    Hand Dominance Right    Next MD Visit 11/02/21 3 pm    Prior Therapy no      Home Environment   Living Environment  Private residence    Living Arrangements Spouse/significant other;Children    Type of Home House    Home Access Level entry    Home Layout One level      Prior Function   Level of Independence Independent    Vocation Self employed    Vocation Requirements bending, stooping, climbing, prolonged standing    Leisure soccer, exercising/walking      ROM / Strength   AROM / PROM / Strength AROM      AROM   Overall AROM Comments wnl throughout right knee and lumbar spine    AROM Assessment Site Knee;Lumbar    Right/Left Knee Right    Right Knee Extension --   wnl     Palpation   Palpation comment tenderness to palpation at medial collateral ligament and medial joint line      Special Tests    Special Tests Knee Special Tests    Knee Special tests  other   Right knee: medial stress test positive: held on McMurrys due to had injection yesterday     other    Findings Positive    Side  Right    Comments medial stress test      Ambulation/Gait   Gait Comments Normal gait                        Objective measurements completed on examination: See above findings.                PT Education - 09/14/21 1020     Education Details Initiated HEP, suggested continue with walking vs. running or soccer, educated patient on anatomy of the knee and lumbar spine Access Code: [email or web address]    Person(s) Educated Patient    Methods Explanation;Demonstration;Handout    Comprehension Verbalized understanding;Returned demonstration              PT Short Term Goals - 09/14/21 1043       PT SHORT TERM GOAL #1   Title Independence with initial HEP    Time 4    Period Weeks    Status New    Target Date 10/12/21      PT SHORT TERM GOAL #2   Title No acute sharp pains in medial right knee that cause a locking episode    Time 4    Period Weeks    Status New  Target Date 10/12/21               PT Long Term Goals - 09/14/21 1044       PT LONG TERM GOAL  #1   Title Indpendence with advance HEP    Time 8    Period Weeks    Status New    Target Date 11/09/21      PT LONG TERM GOAL #2   Title Patient to avoid need for viscosupplementation or PRP injections    Time 8    Period Weeks    Status New    Target Date 11/09/21      PT LONG TERM GOAL #3   Title Patient to be able to bend, stoop and squat to do jobs at work without pain    Time 8    Period Weeks    Status New    Target Date 11/09/21      PT LONG TERM GOAL #4   Title Patient to be able to resume higher level athletic activity and gym workouts    Time 8    Period Weeks    Status New    Target Date 11/09/21      PT LONG TERM GOAL #5   Title FOTO score to be 76    Baseline 72    Time 8    Period Weeks    Status New    Target Date 11/09/21                    Plan - 09/14/21 0849     Clinical Impression Statement Patient is a 35 y.o. male who has several years history of low back pain and onset of right knee pain in February of 2022.  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  He plays a lot of soccer but admits its more of something his friends ask him to do more than something he is trying to get back to.  However, he would like to be able to workout at the gym and do sports for leisure.  He is married and has a newborn daughter.  He wants to be able to care for her and be active with her as she gets older.  He and his wife do a lot of walking currently since he is unable to participate in soccer.   He presents with good ROM throughout right knee and lumbar spine.  He has moderate hamstring tightness bilaterally but R > L.  Quads and hip flexors also moderately tight.  He has tenderness along the medial joint line of the right knee as well as positive valgus stress test.  We did not do McMurrys test due to had injection yesterday and ultrasound, per patient, reveals slight meniscus tear.  No significant crepitus with active flexion/extension of  right knee seated.  No radicular symptoms reported with his lumbar spine issues.  He would respond well to right knee strengthening and stability training along with modalities as needed to control pain,  LE flexibility exercises to reduce stress to the low back and allow for improved right knee mechanics, and education on anatomy and mechanics of the knee.  He admits he speaks good English but that he sometimes has to have his wife explain medical terminology because of the language barrier.    Examination-Activity Limitations Lift;Squat    Examination-Participation Restrictions Occupation;Community Activity    Stability/Clinical Decision Making Stable/Uncomplicated    Clinical Decision Making Low  Rehab Potential Good    PT Frequency 2x / week    PT Duration 8 weeks    PT Treatment/Interventions ADLs/Self Care Home Management;Aquatic Therapy;Cryotherapy;Ultrasound;Moist Heat;Iontophoresis 4mg /ml Dexamethasone;Electrical Stimulation;Gait training;Stair training;Functional mobility training;Neuromuscular re-education;Balance training;Therapeutic exercise;Therapeutic activities;Patient/family education;Orthotic Fit/Training;Manual techniques;Passive range of motion;Dry needling;Taping;Vasopneumatic Device;Joint Manipulations    PT Next Visit Plan Begin quad rehab and core strength    PT Home Exercise Plan Access Code: [email or web address]    Consulted and Agree with Plan of Care Patient             Patient will benefit from skilled therapeutic intervention in order to improve the following deficits and impairments:  Abnormal gait, Decreased mobility, Difficulty walking, Increased muscle spasms, Decreased strength, Hypermobility, Impaired flexibility, Pain  Visit Diagnosis: Acute pain of right knee - Plan: PT plan of care cert/re-cert  Difficulty in walking, not elsewhere classified - Plan: PT plan of care cert/re-cert  Chronic bilateral low back pain without sciatica - Plan: PT plan of care  cert/re-cert    [facility] PT Assessment - 09/14/21 0001       Assessment   Medical Diagnosis Chronic right knee pain: Low back pain without sciatica    Referring Provider (PT) 09/16/21, DO    Onset Date/Surgical Date 12/19/20    Hand Dominance Right    Next MD Visit 11/02/21 3 pm    Prior Therapy no      Home Environment   Living Environment Private residence    Living Arrangements Spouse/significant other;Children    Type of Home House    Home Access Level entry    Home Layout One level      Prior Function   Level of Independence Independent    Vocation Self employed    Vocation Requirements bending, stooping, climbing, prolonged standing    Leisure soccer, exercising/walking      ROM / Strength   AROM / PROM / Strength AROM      AROM   Overall AROM Comments wnl throughout right knee and lumbar spine    AROM Assessment Site Knee;Lumbar    Right/Left Knee Right    Right Knee Extension --   wnl     Palpation   Palpation comment tenderness to palpation at medial collateral ligament and medial joint line      Special Tests    Special Tests Knee Special Tests    Knee Special tests  other   Right knee: medial stress test positive: held on McMurrys due to had injection yesterday     other    Findings Positive    Side  Right    Comments medial stress test      Ambulation/Gait   Gait Comments Normal gait              Problem List Patient Active Problem List   Diagnosis Date Noted   Back pain 06/06/2021   Somatic dysfunction of spine, lumbar 06/06/2021   Somatic dysfunction of spine, sacral 06/06/2021   Knee pain 08/06/2020   Fatigue 12/07/2018   Low libido 12/07/2018    Dael Howland B. Gavin Telford, PT 09/14/2209:51 AM   [facility] [address] [place], [place], [place] Phone: [phone]   Fax:  [phone]  Name: Sayf Kerner MRN: [name] Date of Birth: [birth year]

## 2021-09-20 ENCOUNTER — Ambulatory Visit: Payer: 59 | Attending: Family Medicine

## 2021-09-20 ENCOUNTER — Other Ambulatory Visit: Payer: Self-pay

## 2021-09-20 DIAGNOSIS — M545 Low back pain, unspecified: Secondary | ICD-10-CM | POA: Diagnosis not present

## 2021-09-20 DIAGNOSIS — M25561 Pain in right knee: Secondary | ICD-10-CM | POA: Diagnosis not present

## 2021-09-20 DIAGNOSIS — R262 Difficulty in walking, not elsewhere classified: Secondary | ICD-10-CM | POA: Diagnosis not present

## 2021-09-20 DIAGNOSIS — G8929 Other chronic pain: Secondary | ICD-10-CM | POA: Insufficient documentation

## 2021-09-20 NOTE — Therapy (Signed)
[facility] [address] [place], [place], [address] Phone: [phone]   Fax:  [phone]  Physical Therapy Treatment  Patient Details  Name: Manuel Barnes MRN: [id number] Date of Birth: [birth year] Referring Provider (PT): Antoine Primas, DO   Encounter Date: 09/20/2021   PT End of Session - 09/20/21 1724     Visit Number 2    Date for PT Re-Evaluation 10/12/21    Authorization Type UMR    PT Start Time 1615    PT Stop Time 1720    PT Time Calculation (min) 65 min    Activity Tolerance Patient tolerated treatment well    Behavior During Therapy [facility] for tasks assessed/performed             History reviewed. No pertinent past medical history.  History reviewed. No pertinent surgical history.  There were no vitals filed for this visit.   Subjective Assessment - 09/20/21 1619     Subjective I did all of my exercises up until Tuesday.  I had a lot of pain on Tuesday and just held off on all of my exercises.  No pain today.    Limitations Standing;Lifting    How long can you sit comfortably? unlimited    How long can you stand comfortably? 30 min    How long can you walk comfortably? unlimted unless I feel that sharp pain    Diagnostic tests MRI and [place] of right knee: patient states he was told medial meniscus but report is not visible yet.  MD notes state "sprain" and degenerative chondral changes.  Lumbar MRI states L4-S1 disc bulges and facet irritation.    Patient Stated Goals To be able to exercise and be active as well as do my job without having to compensate and worry about exacerbations of pain.  He explains that he really doesn't enjoy soccer that much, but his friends are always asking him to play.    Currently in Pain? No/denies    Pain Onset More than a month ago    Pain Onset More than a month ago                               [facility] Adult PT Treatment/Exercise - 09/20/21  0001       Lumbar Exercises: Aerobic   Elliptical L1, x 3 min full incline      Knee/Hip Exercises: Stretches   Active Hamstring Stretch Both;3 reps;30 seconds    ITB Stretch 3 reps;30 seconds      Knee/Hip Exercises: Standing   Rebounder yellow ball, 3 direction SLS x 20 ea, 1st set on floor, 2nd set on balance pad      Knee/Hip Exercises: Seated   Long Arc Quad Strengthening;Right;1 set;20 reps;Weights   5lb ankle weight     Knee/Hip Exercises: Supine   Quad Sets Strengthening;Both;1 set;20 reps    Short Arc Quad Sets Strengthening;Right;1 set;20 reps   1st set with no weight, 2nd set with 5lb   Terminal Knee Extension Strengthening;Both;1 set;20 reps   pool noodle under knees     Modalities   Modalities Cryotherapy   game ready medium pressure x 10 min post treatment                    PT Education - 09/20/21 1722     Education Details Lengthy discussion about knee anatomy and MRI not  indicating meniscus tear.  Educated on need to control pain and effusion consistently to allow healing.    Person(s) Educated Patient    Methods Explanation    Comprehension Verbalized understanding              PT Short Term Goals - 09/14/21 1043       PT SHORT TERM GOAL #1   Title Independence with initial HEP    Time 4    Period Weeks    Status New    Target Date 10/12/21      PT SHORT TERM GOAL #2   Title No acute sharp pains in medial right knee that cause a locking episode    Time 4    Period Weeks    Status New    Target Date 10/12/21               PT Long Term Goals - 09/14/21 1044       PT LONG TERM GOAL #1   Title Indpendence with advance HEP    Time 8    Period Weeks    Status New    Target Date 11/09/21      PT LONG TERM GOAL #2   Title Patient to avoid need for viscosupplementation or PRP injections    Time 8    Period Weeks    Status New    Target Date 11/09/21      PT LONG TERM GOAL #3   Title Patient to be able to bend, stoop  and squat to do jobs at work without pain    Time 8    Period Weeks    Status New    Target Date 11/09/21      PT LONG TERM GOAL #4   Title Patient to be able to resume higher level athletic activity and gym workouts    Time 8    Period Weeks    Status New    Target Date 11/09/21      PT LONG TERM GOAL #5   Title FOTO score to be 76    Baseline 72    Time 8    Period Weeks    Status New    Target Date 11/09/21                   Plan - 09/20/21 1725     Clinical Impression Statement Patient doing well today but had exacerbation of pain on Tuesday.  He states he took "aspirin" and has been icing a lot.  He was able to complete all tasks today pain free but had significant difficulty attaining extension position for game ready post exercise.  He demonstrates excellent balance and stability through the knee with static and dynamic balance.  May need to be cautious with end range extension as he has some findings on MRI of involvement at the attachment but no tears on MRI.  We will continue with current POC but assess if patient is responding after approx  4 weeks.  If patient is not responding and continuing to have pain, we will update MD.    Examination-Activity Limitations Lift;Squat    Examination-Participation Restrictions Occupation;Community Activity    Stability/Clinical Decision Making Stable/Uncomplicated    Rehab Potential Good    PT Frequency 2x / week    PT Duration 8 weeks    PT Treatment/Interventions ADLs/Self Care Home Management;Aquatic Therapy;Cryotherapy;Ultrasound;Moist Heat;Iontophoresis 4mg /ml Dexamethasone;Electrical Stimulation;Gait training;Stair training;Functional mobility training;Neuromuscular re-education;Balance training;Therapeutic exercise;Therapeutic activities;Patient/family education;Orthotic Fit/Training;Manual techniques;Passive  range of motion;Dry needling;Taping;Vasopneumatic Device;Joint Manipulations    PT Next Visit Plan Progress quad  rehab considering possible ACL involvement, and core strength    PT Home Exercise Plan Access Code: [email or web address]    Consulted and Agree with Plan of Care Patient             Patient will benefit from skilled therapeutic intervention in order to improve the following deficits and impairments:  Abnormal gait, Decreased mobility, Difficulty walking, Increased muscle spasms, Decreased strength, Hypermobility, Impaired flexibility, Pain  Visit Diagnosis: Acute pain of right knee  Difficulty in walking, not elsewhere classified  Chronic bilateral low back pain without sciatica     Problem List Patient Active Problem List   Diagnosis Date Noted   Back pain 06/06/2021   Somatic dysfunction of spine, lumbar 06/06/2021   Somatic dysfunction of spine, sacral 06/06/2021   Knee pain 08/06/2020   Fatigue 12/07/2018   Low libido 12/07/2018    Emmilia Sowder B. Roshanna Cimino, PT 11/03/225:38 PM   [facility] [address] [place], [place], [address] Phone: [phone]   Fax:  [phone]  Name: Micharl Helmes MRN: [id number] Date of Birth: [birth year]

## 2021-09-25 ENCOUNTER — Other Ambulatory Visit: Payer: Self-pay

## 2021-09-25 ENCOUNTER — Ambulatory Visit: Payer: 59

## 2021-09-25 DIAGNOSIS — G8929 Other chronic pain: Secondary | ICD-10-CM

## 2021-09-25 DIAGNOSIS — M545 Low back pain, unspecified: Secondary | ICD-10-CM

## 2021-09-25 DIAGNOSIS — R262 Difficulty in walking, not elsewhere classified: Secondary | ICD-10-CM | POA: Diagnosis not present

## 2021-09-25 DIAGNOSIS — M25561 Pain in right knee: Secondary | ICD-10-CM

## 2021-09-25 NOTE — Patient Instructions (Signed)
Avoid open chain terminal extension due to MRI showing some irritation/signal at insertion.  Ok to do open chain to -20 to -30 of full extension.

## 2021-09-25 NOTE — Therapy (Signed)
[facility] Out[facility] & Specialty Rehab @ [facility] [address] [place], [place], [address] Phone: [phone]   Fax:  [phone]  Physical Therapy Treatment  Patient Details  Name: Manuel Barnes MRN: [id number] Date of Birth: 1986-08-02 Referring Provider (PT): Antoine Primas, DO   Encounter Date: 09/25/2021   PT End of Session - 09/25/21 1703     Visit Number 3    Date for PT Re-Evaluation 10/12/21    Authorization Type UMR    PT Start Time 1630    PT Stop Time 1708    PT Time Calculation (min) 38 min    Activity Tolerance Patient tolerated treatment well    Behavior During Therapy [facility] for tasks assessed/performed             History reviewed. No pertinent past medical history.  History reviewed. No pertinent surgical history.  There were no vitals filed for this visit.   Subjective Assessment - 09/25/21 1619     Subjective Patient states he did well after last visit.  He had little to no pain since last visit but is also not doing anything athletic or going to the gym.  He is only doing the HEP and attending PT sessions.    Limitations Standing;Lifting    How long can you sit comfortably? unlimited    How long can you stand comfortably? 30 min    How long can you walk comfortably? unlimted unless I feel that sharp pain    Diagnostic tests MRI and [place] of right knee: patient states he was told medial meniscus but report is not visible yet.  MD notes state "sprain" and degenerative chondral changes.  Lumbar MRI states L4-S1 disc bulges and facet irritation.    Patient Stated Goals To be able to exercise and be active as well as do my job without having to compensate and worry about exacerbations of pain.  He explains that he really doesn't enjoy soccer that much, but his friends are always asking him to play.    Currently in Pain? No/denies    Pain Onset More than a month ago                               [facility] Adult PT  Treatment/Exercise - 09/25/21 0001       Lumbar Exercises: Aerobic   Stationary Bike Level 3 x 5 min      Knee/Hip Exercises: Stretches   Active Hamstring Stretch Both;2 reps;30 seconds    Quad Stretch Both;2 reps;30 seconds    Hip Flexor Stretch Both;2 reps;30 seconds      Knee/Hip Exercises: Machines for Strengthening   Total Gym Leg Press 70 lb , seat 6 x 20      Knee/Hip Exercises: Standing   Rebounder yellow ball, 3 direction SLS x 20 ea on balance pad    Other Standing Knee Exercises Cone touches x 20 cone on treatment table x 20 cone on balance pad on floor.    Other Standing Knee Exercises Band walks (side stepping) yellow loop 3 laps of 10 step      Knee/Hip Exercises: Seated   Long Arc Quad Strengthening;Right;1 set;20 reps;Weights   5lb ankle weight  avoid last 20 degrees of extension due to ACL irritation     Modalities   Modalities Cryotherapy   game ready medium pressure x 10 min post treatment  PT Education - 09/25/21 1701     Education Details Reviewed anatomy and to avoid loaded termimal open chain extension.  Avoid leg extension machine all together at gym if he happens to go.    Person(s) Educated Patient    Methods Explanation;Verbal cues    Comprehension Verbalized understanding;Returned demonstration              PT Short Term Goals - 09/14/21 1043       PT SHORT TERM GOAL #1   Title Independence with initial HEP    Time 4    Period Weeks    Status New    Target Date 10/12/21      PT SHORT TERM GOAL #2   Title No acute sharp pains in medial right knee that cause a locking episode    Time 4    Period Weeks    Status New    Target Date 10/12/21               PT Long Term Goals - 09/14/21 1044       PT LONG TERM GOAL #1   Title Indpendence with advance HEP    Time 8    Period Weeks    Status New    Target Date 11/09/21      PT LONG TERM GOAL #2   Title Patient to avoid need for  viscosupplementation or PRP injections    Time 8    Period Weeks    Status New    Target Date 11/09/21      PT LONG TERM GOAL #3   Title Patient to be able to bend, stoop and squat to do jobs at work without pain    Time 8    Period Weeks    Status New    Target Date 11/09/21      PT LONG TERM GOAL #4   Title Patient to be able to resume higher level athletic activity and gym workouts    Time 8    Period Weeks    Status New    Target Date 11/09/21      PT LONG TERM GOAL #5   Title FOTO score to be 76    Baseline 72    Time 8    Period Weeks    Status New    Target Date 11/09/21                   Plan - 09/25/21 1703     Clinical Impression Statement Patient is completing all tasks without pain but has quite a bit of discomfort at end of session.  This pain tends to be anterior and anteromedial.  This is likely due to chondral damage during injury and possibly ACL irritation.  He seems to be understanding his injury more clearly and is very compliant and well motivated, completing his HEP daily.  He would benefit from continue skilled PT for quad rehab and knee stability training.    Examination-Activity Limitations Lift;Squat    Examination-Participation Restrictions Occupation;Community Activity    Stability/Clinical Decision Making Stable/Uncomplicated    Clinical Decision Making Low    Rehab Potential Good    PT Frequency 2x / week    PT Duration 8 weeks    PT Treatment/Interventions ADLs/Self Care Home Management;Aquatic Therapy;Cryotherapy;Ultrasound;Moist Heat;Iontophoresis 4mg /ml Dexamethasone;Electrical Stimulation;Gait training;Stair training;Functional mobility training;Neuromuscular re-education;Balance training;Therapeutic exercise;Therapeutic activities;Patient/family education;Orthotic Fit/Training;Manual techniques;Passive range of motion;Dry needling;Taping;Vasopneumatic Device;Joint Manipulations    PT Next Visit Plan Progress quad rehab  considering  possible ACL involvement, and core strength    PT Home Exercise Plan Access Code: [email or web address]    Consulted and Agree with Plan of Care Patient             Patient will benefit from skilled therapeutic intervention in order to improve the following deficits and impairments:  Abnormal gait, Decreased mobility, Difficulty walking, Increased muscle spasms, Decreased strength, Hypermobility, Impaired flexibility, Pain  Visit Diagnosis: Difficulty in walking, not elsewhere classified  Chronic bilateral low back pain without sciatica  Acute pain of right knee     Problem List Patient Active Problem List   Diagnosis Date Noted   Back pain 06/06/2021   Somatic dysfunction of spine, lumbar 06/06/2021   Somatic dysfunction of spine, sacral 06/06/2021   Knee pain 08/06/2020   Fatigue 12/07/2018   Low libido 12/07/2018    Madelyn Tlatelpa B. Lakayla Barrington, PT 11/08/225:10 PM   [facility] Outpatient & Specialty Rehab @ [facility] [address] [place], [place], [address] Phone: [phone]   Fax:  [phone]  Name: Manuel Barnes MRN: [id number] Date of Birth: 08/29/1986

## 2021-09-27 ENCOUNTER — Other Ambulatory Visit: Payer: Self-pay

## 2021-09-27 ENCOUNTER — Ambulatory Visit: Payer: 59

## 2021-09-27 DIAGNOSIS — G8929 Other chronic pain: Secondary | ICD-10-CM | POA: Diagnosis not present

## 2021-09-27 DIAGNOSIS — M25561 Pain in right knee: Secondary | ICD-10-CM | POA: Diagnosis not present

## 2021-09-27 DIAGNOSIS — M545 Low back pain, unspecified: Secondary | ICD-10-CM | POA: Diagnosis not present

## 2021-09-27 DIAGNOSIS — R262 Difficulty in walking, not elsewhere classified: Secondary | ICD-10-CM

## 2021-09-27 NOTE — Patient Instructions (Signed)
Continue current HEP and using ice.

## 2021-09-27 NOTE — Therapy (Signed)
[facility] Outpatient & Specialty Rehab @ [facility] [address] [place], [place], [address] Phone: [phone]   Fax:  [phone]  Physical Therapy Treatment  Patient Details  Name: Manuel Barnes MRN: [id number] Date of Birth: Dec 25, 1985 Referring Provider (PT): Antoine Primas, DO   Encounter Date: 09/27/2021   PT End of Session - 09/27/21 1555     Visit Number 4    Date for PT Re-Evaluation 10/12/21    Authorization Type UMR    PT Start Time 1530    PT Stop Time 1615    PT Time Calculation (min) 45 min    Activity Tolerance Patient tolerated treatment well    Behavior During Therapy [facility] for tasks assessed/performed             History reviewed. No pertinent past medical history.  History reviewed. No pertinent surgical history.  There were no vitals filed for this visit.   Subjective Assessment - 09/27/21 1539     Subjective Patient arrives stating he is "excited" because his pain is so much better.  He is icing more and he feels stronger.    Limitations Standing;Lifting    How long can you sit comfortably? unlimited    How long can you stand comfortably? 30 min    How long can you walk comfortably? unlimted unless I feel that sharp pain    Diagnostic tests MRI and [place] of right knee: patient states he was told medial meniscus but report is not visible yet.  MD notes state "sprain" and degenerative chondral changes.  Lumbar MRI states L4-S1 disc bulges and facet irritation.    Patient Stated Goals To be able to exercise and be active as well as do my job without having to compensate and worry about exacerbations of pain.  He explains that he really doesn't enjoy soccer that much, but his friends are always asking him to play.    Currently in Pain? No/denies    Pain Onset More than a month ago    Multiple Pain Sites Yes    Pain Score 0    Pain Onset More than a month ago                               [facility] Adult PT  Treatment/Exercise - 09/27/21 0001       Lumbar Exercises: Aerobic   Stationary Bike Level 3 x 5 min      Knee/Hip Exercises: Stretches   Active Hamstring Stretch Both;2 reps;30 seconds    Quad Stretch Both;2 reps;30 seconds    Hip Flexor Stretch Both;2 reps;30 seconds    ITB Stretch 3 reps;30 seconds      Knee/Hip Exercises: Machines for Strengthening   Total Gym Leg Press 75 lb , seat 6 x 20   also single leg 50 lb x 20 right     Knee/Hip Exercises: Standing   Rebounder blue ball, 3 direction SLS x 20 ea on balance pad    Other Standing Knee Exercises Cone touches x 20  cone on balance pad on floor.    Other Standing Knee Exercises Band walks (side stepping) yellow loop 3 laps of 10 step                     PT Education - 09/27/21 1554     Education Details Reviewed stretches and educated on proper technique    Person(s) Educated Patient  Methods Explanation;Demonstration;Verbal cues    Comprehension Verbalized understanding;Returned demonstration;Verbal cues required              PT Short Term Goals - 09/14/21 1043       PT SHORT TERM GOAL #1   Title Independence with initial HEP    Time 4    Period Weeks    Status New    Target Date 10/12/21      PT SHORT TERM GOAL #2   Title No acute sharp pains in medial right knee that cause a locking episode    Time 4    Period Weeks    Status New    Target Date 10/12/21               PT Long Term Goals - 09/14/21 1044       PT LONG TERM GOAL #1   Title Indpendence with advance HEP    Time 8    Period Weeks    Status New    Target Date 11/09/21      PT LONG TERM GOAL #2   Title Patient to avoid need for viscosupplementation or PRP injections    Time 8    Period Weeks    Status New    Target Date 11/09/21      PT LONG TERM GOAL #3   Title Patient to be able to bend, stoop and squat to do jobs at work without pain    Time 8    Period Weeks    Status New    Target Date 11/09/21       PT LONG TERM GOAL #4   Title Patient to be able to resume higher level athletic activity and gym workouts    Time 8    Period Weeks    Status New    Target Date 11/09/21      PT LONG TERM GOAL #5   Title FOTO score to be 76    Baseline 72    Time 8    Period Weeks    Status New    Target Date 11/09/21                   Plan - 09/27/21 1555     Clinical Impression Statement Patient is beginning to show response to stretching and strengthening.  He is reporting decrease in pain.  He is also verbalizing understanding of HEP and need to use ice.  He demonstrates good stability and control with single leg balance activity.  We were able to add single leg mini squat to raised treatment table with no increase in pain.  He would benefit from continued skilled PT for quad rehab, hip strength and flexibility.    Examination-Activity Limitations Lift;Squat    Examination-Participation Restrictions Occupation;Community Activity    Stability/Clinical Decision Making Stable/Uncomplicated    Clinical Decision Making Low    Rehab Potential Good    PT Frequency 2x / week    PT Duration 8 weeks    PT Treatment/Interventions ADLs/Self Care Home Management;Aquatic Therapy;Cryotherapy;Ultrasound;Moist Heat;Iontophoresis 4mg /ml Dexamethasone;Electrical Stimulation;Gait training;Stair training;Functional mobility training;Neuromuscular re-education;Balance training;Therapeutic exercise;Therapeutic activities;Patient/family education;Orthotic Fit/Training;Manual techniques;Passive range of motion;Dry needling;Taping;Vasopneumatic Device;Joint Manipulations    PT Next Visit Plan Progress right LE strength and stability,  Possibly add split squat if pain level still 0/10.    PT Home Exercise Plan Access Code: [facility]             Patient will benefit from skilled therapeutic intervention in  order to improve the following deficits and impairments:  Abnormal gait, Decreased mobility, Difficulty  walking, Increased muscle spasms, Decreased strength, Hypermobility, Impaired flexibility, Pain  Visit Diagnosis: Difficulty in walking, not elsewhere classified  Chronic bilateral low back pain without sciatica  Acute pain of right knee     Problem List Patient Active Problem List   Diagnosis Date Noted   Back pain 06/06/2021   Somatic dysfunction of spine, lumbar 06/06/2021   Somatic dysfunction of spine, sacral 06/06/2021   Knee pain 08/06/2020   Fatigue 12/07/2018   Low libido 12/07/2018    Camrie Stock B. Tandrea Kommer, PT 11/10/224:20 PM   [facility] Outpatient & Specialty Rehab @ [facility] [address] [place], [place], [address] Phone: [phone]   Fax:  [phone]  Name: Manuel Barnes MRN: [id number] Date of Birth: 1986-01-04

## 2021-10-03 ENCOUNTER — Encounter: Payer: Self-pay | Admitting: Rehabilitative and Restorative Service Providers"

## 2021-10-03 ENCOUNTER — Other Ambulatory Visit: Payer: Self-pay

## 2021-10-03 ENCOUNTER — Ambulatory Visit: Payer: 59 | Admitting: Rehabilitative and Restorative Service Providers"

## 2021-10-03 DIAGNOSIS — G8929 Other chronic pain: Secondary | ICD-10-CM | POA: Diagnosis not present

## 2021-10-03 DIAGNOSIS — R262 Difficulty in walking, not elsewhere classified: Secondary | ICD-10-CM | POA: Diagnosis not present

## 2021-10-03 DIAGNOSIS — M25561 Pain in right knee: Secondary | ICD-10-CM | POA: Diagnosis not present

## 2021-10-03 DIAGNOSIS — M545 Low back pain, unspecified: Secondary | ICD-10-CM

## 2021-10-03 NOTE — Therapy (Signed)
[facility] @ [facility] [place], [place], [address] Phone: [phone]   Fax:  [phone]  Physical Therapy Treatment  Patient Details  Name: Manuel Barnes MRN: [id number] Date of Birth: 1985-11-21 Referring Provider (PT): Hulan Saas, DO   Encounter Date: 10/03/2021   PT End of Session - 10/03/21 0820     Visit Number 5    Date for PT Re-Evaluation 10/12/21    Authorization Type UMR    PT Start Time 0732    PT Stop Time 0810    PT Time Calculation (min) 38 min    Activity Tolerance Patient tolerated treatment well    Behavior During Therapy [facility] for tasks assessed/performed             History reviewed. No pertinent past medical history.  History reviewed. No pertinent surgical history.  There were no vitals filed for this visit.   Subjective Assessment - 10/03/21 0818     Subjective Pt reports that he is feeling less pain and feeling stronger.    Patient Stated Goals To be able to exercise and be active as well as do my job without having to compensate and worry about exacerbations of pain.  He explains that he really doesn't enjoy soccer that much, but his friends are always asking him to play.    Currently in Pain? Yes    Pain Score 1     Pain Location Knee    Pain Orientation Right    Pain Descriptors / Indicators Discomfort    Pain Type Chronic pain                               [facility] Adult PT Treatment/Exercise - 10/03/21 0001       Lumbar Exercises: Aerobic   Stationary Bike Level 3 x 5 min    Tread Mill pulling treadmill track with unilateral foot (unit turned off) x10B      Lumbar Exercises: Machines for Strengthening   Other Lumbar Machine Exercise 4 way hip on multgym 13# 2x10      Lumbar Exercises: Standing   Functional Squats 20 reps    Functional Squats Limitations 2x10 mini squats on bosu ball    Other Standing Lumbar Exercises Step up on lower PT mat  x10B    Other Standing Lumbar Exercises Side stepping with red resistance loop down PT gym and back with red loop x3 laps in mini squat position      Knee/Hip Exercises: Stretches   Active Hamstring Stretch Both;1 rep;20 seconds    Active Hamstring Stretch Limitations in standing    Quad Stretch Both;1 rep;20 seconds    Quad Stretch Limitations in standing    ITB Stretch Both;1 rep;20 seconds    ITB Stretch Limitations in supine with strap      Knee/Hip Exercises: Machines for Strengthening   Total Gym Leg Press 80# 2x10.  RLE only 50# x10   seat at 6                      PT Short Term Goals - 10/03/21 0830       PT SHORT TERM GOAL #1   Title Independence with initial HEP    Status Achieved      PT SHORT TERM GOAL #2   Title No acute sharp pains in medial right knee that cause a locking episode  Status Partially Met               PT Long Term Goals - 10/03/21 0830       PT LONG TERM GOAL #1   Title Indpendence with advance HEP    Status On-going      PT LONG TERM GOAL #2   Title Patient to avoid need for viscosupplementation or PRP injections    Status On-going      PT LONG TERM GOAL #3   Title Patient to be able to bend, stoop and squat to do jobs at work without pain    Status On-going      PT LONG TERM GOAL #4   Title Patient to be able to resume higher level athletic activity and gym workouts    Status On-going      PT LONG TERM GOAL #5   Title FOTO score to be 76    Status On-going                   Plan - 10/03/21 0821     Clinical Impression Statement Pt able to correctly demo all stretching and verbalizes his compliance with HEP. Progressed with hip and knee strengthening during session. Pt initially with difficulty with mini squats on bosu ball, but his balance improved and he was able to complete 2nd set with less difficulty. Pt tolerated addition of 4 way hip machine on multgym well, no increased pain. Pt with reports of  fatigue with step up exercise on low mat, but denies pain. Pt only requires min cuing for technique/posture during exercises. Pt would benefit from continued skilled to continue to progress functional strength to allow him to return to his active lifestyle and working without pain or compensations.    PT Treatment/Interventions ADLs/Self Care Home Management;Aquatic Therapy;Cryotherapy;Ultrasound;Moist Heat;Iontophoresis 11m/ml Dexamethasone;Electrical Stimulation;Gait training;Stair training;Functional mobility training;Neuromuscular re-education;Balance training;Therapeutic exercise;Therapeutic activities;Patient/family education;Orthotic Fit/Training;Manual techniques;Passive range of motion;Dry needling;Taping;Vasopneumatic Device;Joint Manipulations    PT Next Visit Plan Progress right LE strength and stability,  Possibly add split squat if pain level still 0/10.    Consulted and Agree with Plan of Care Patient             Patient will benefit from skilled therapeutic intervention in order to improve the following deficits and impairments:  Abnormal gait, Decreased mobility, Difficulty walking, Increased muscle spasms, Decreased strength, Hypermobility, Impaired flexibility, Pain  Visit Diagnosis: Difficulty in walking, not elsewhere classified  Chronic bilateral low back pain without sciatica  Acute pain of right knee     Problem List Patient Active Problem List   Diagnosis Date Noted   Back pain 06/06/2021   Somatic dysfunction of spine, lumbar 06/06/2021   Somatic dysfunction of spine, sacral 06/06/2021   Knee pain 08/06/2020   Fatigue 12/07/2018   Low libido 12/07/2018    SJuel Burrow PT, DPT 10/03/2021, 8:31 AM  [email or web address] [id number]: [phone]  Fax:  [phone] Name: Manuel AnzaloneMRN: [id number] of Birth: 1Mar 03, 1987

## 2021-10-05 ENCOUNTER — Other Ambulatory Visit: Payer: Self-pay

## 2021-10-05 ENCOUNTER — Ambulatory Visit: Payer: 59 | Admitting: Physical Therapy

## 2021-10-05 ENCOUNTER — Encounter: Payer: Self-pay | Admitting: Physical Therapy

## 2021-10-05 DIAGNOSIS — G8929 Other chronic pain: Secondary | ICD-10-CM

## 2021-10-05 DIAGNOSIS — M25561 Pain in right knee: Secondary | ICD-10-CM | POA: Diagnosis not present

## 2021-10-05 DIAGNOSIS — R262 Difficulty in walking, not elsewhere classified: Secondary | ICD-10-CM | POA: Diagnosis not present

## 2021-10-05 DIAGNOSIS — M545 Low back pain, unspecified: Secondary | ICD-10-CM | POA: Diagnosis not present

## 2021-10-05 NOTE — Therapy (Signed)
[facility] @ [place], [place], [address] Phone: [phone]   Fax:  [phone]  Physical Therapy Treatment  Patient Details  Name: Manuel Barnes MRN: [id number] Date of Birth: [birth year] Referring Provider (PT): Hulan Saas, DO   Encounter Date: 10/05/2021   PT End of Session - 10/05/21 0802     Visit Number 6    Date for PT Re-Evaluation 10/12/21    Authorization Type UMR    PT Start Time 0802    PT Stop Time 0900    PT Time Calculation (min) 58 min             History reviewed. No pertinent past medical history.  History reviewed. No pertinent surgical history.  There were no vitals filed for this visit.   Subjective Assessment - 10/05/21 0806     Subjective My knee is better I want it to be more better. Pain is pretty much abolished but it still gets sore. I was very sore after my last visit    Diagnostic tests MRI and [place] of right knee: patient states he was told medial meniscus but report is not visible yet.  MD notes state "sprain" and degenerative chondral changes.  Lumbar MRI states L4-S1 disc bulges and facet irritation.    Currently in Pain? No/denies                               [facility] Adult PT Treatment/Exercise - 10/05/21 0001       Knee/Hip Exercises: Aerobic   Recumbent Bike L3 x 10 min with discussion of current status      Knee/Hip Exercises: Machines for Strengthening   Total Gym Leg Press Seat 6: Bil 80# 2 x15 RTLE 50# 3x10 VC to slow eccentrics      Knee/Hip Exercises: Standing   Other Standing Knee Exercises Attempted RT SLS with hip hinge, had to stop too painful this AM    Other Standing Knee Exercises Yellow band side steps sink to coat rack 4x      Knee/Hip Exercises: Supine   Bridges with Clamshell Both;Strengthening;1 set;10 reps   yellow   Other Supine Knee/Hip Exercises Ball squeezes 5 sec hold 10x      Vasopneumatic   Number  Minutes Vasopneumatic  15 minutes    Vasopnuematic Location  Knee    Vasopneumatic Pressure Medium    Vasopneumatic Temperature  3 flakes                       PT Short Term Goals - 10/03/21 0830       PT SHORT TERM GOAL #1   Title Independence with initial HEP    Status Achieved      PT SHORT TERM GOAL #2   Title No acute sharp pains in medial right knee that cause a locking episode    Status Partially Met               PT Long Term Goals - 10/03/21 0830       PT LONG TERM GOAL #1   Title Indpendence with advance HEP    Status On-going      PT LONG TERM GOAL #2   Title Patient to avoid need for viscosupplementation or PRP injections    Status On-going      PT LONG TERM GOAL #3   Title Patient to be  able to bend, stoop and squat to do jobs at work without pain    Status On-going      PT LONG TERM GOAL #4   Title Patient to be able to resume higher level athletic activity and gym workouts    Status On-going      PT LONG TERM GOAL #5   Title FOTO score to be 76    Status On-going                   Plan - 10/05/21 0847     Clinical Impression Statement Pt reporting much improvement overall. He reports his "bad pain" is pretty much abolished but the patella and medial stay pretty "sore." He reports very sore for a day after his last PT session, he thinks not icing post workout made a difference and requested doing that at the end of todays session. No pain with leg press today, had some sharper pains in standing that stopped pt from completing the exercise. Supine bridge very shakey in hips/pelvis.    Examination-Activity Limitations Lift;Squat    Examination-Participation Restrictions Occupation;Community Activity    Stability/Clinical Decision Making Stable/Uncomplicated    Rehab Potential Good    PT Frequency 2x / week    PT Treatment/Interventions ADLs/Self Care Home Management;Aquatic Therapy;Cryotherapy;Ultrasound;Moist  Heat;Iontophoresis 3m/ml Dexamethasone;Electrical Stimulation;Gait training;Stair training;Functional mobility training;Neuromuscular re-education;Balance training;Therapeutic exercise;Therapeutic activities;Patient/family education;Orthotic Fit/Training;Manual techniques;Passive range of motion;Dry needling;Taping;Vasopneumatic Device;Joint Manipulations    PT Next Visit Plan Add bridge to HEP/ glute strength, standing strength/stabilization monitoring pain, Game Ready at end of session    PT Home Exercise Plan Access Code: 8[facility]   Consulted and Agree with Plan of Care Patient             Patient will benefit from skilled therapeutic intervention in order to improve the following deficits and impairments:  Abnormal gait, Decreased mobility, Difficulty walking, Increased muscle spasms, Decreased strength, Hypermobility, Impaired flexibility, Pain  Visit Diagnosis: Difficulty in walking, not elsewhere classified  Chronic bilateral low back pain without sciatica  Acute pain of right knee     Problem List Patient Active Problem List   Diagnosis Date Noted   Back pain 06/06/2021   Somatic dysfunction of spine, lumbar 06/06/2021   Somatic dysfunction of spine, sacral 06/06/2021   Knee pain 08/06/2020   Fatigue 12/07/2018   Low libido 12/07/2018    Ariea Rochin, PTA 10/05/2021, 8:51 AM  [email or web address] [address]Phone: [phone]  Fax:  [phone] Name: Manuel BrumettMRN: [id number] of Birth: 1August 16, 1987

## 2021-10-09 ENCOUNTER — Encounter: Payer: 59 | Admitting: Physical Therapy

## 2021-10-10 ENCOUNTER — Ambulatory Visit: Payer: 59

## 2021-10-10 ENCOUNTER — Other Ambulatory Visit: Payer: Self-pay

## 2021-10-10 DIAGNOSIS — M545 Low back pain, unspecified: Secondary | ICD-10-CM

## 2021-10-10 DIAGNOSIS — M25561 Pain in right knee: Secondary | ICD-10-CM | POA: Diagnosis not present

## 2021-10-10 DIAGNOSIS — R262 Difficulty in walking, not elsewhere classified: Secondary | ICD-10-CM | POA: Diagnosis not present

## 2021-10-10 DIAGNOSIS — G8929 Other chronic pain: Secondary | ICD-10-CM | POA: Diagnosis not present

## 2021-10-10 NOTE — Patient Instructions (Signed)
Patient instructed to focus on hamstring and hip flexor stretching prior to bed for the next few days to see if this affects his ability to sleep in bed.

## 2021-10-10 NOTE — Therapy (Signed)
[place] [place] @ [place], [place], [address] Phone: [phone]   Fax:  [phone]  Physical Therapy Treatment  Patient Details  Name: Manuel Barnes MRN: [id number] Date of Birth: 28-Dec-1985 Referring Provider (PT): Hulan Saas, DO   Encounter Date: 10/10/2021   PT End of Session - 10/10/21 0935     Visit Number 7    Date for PT Re-Evaluation 11/07/21    Authorization Type UMR    PT Start Time 0800    PT Stop Time 0846    PT Time Calculation (min) 46 min    Activity Tolerance Patient tolerated treatment well    Behavior During Therapy [facility] for tasks assessed/performed             History reviewed. No pertinent past medical history.  History reviewed. No pertinent surgical history.  There were no vitals filed for this visit.   Subjective Assessment - 10/10/21 0802     Subjective Re-eval today.  Patient states he is definitely improving.  He states he is about 65% better.  He feels like he is still concerned about returning to a more aggressive workout program so he would like to continue coming to be sure his knee is prepared for a more advanced exercise program and to be sure he is doing well enough to begin that.    Limitations Standing;Lifting    How long can you sit comfortably? unlimited    How long can you stand comfortably? 30 min    How long can you walk comfortably? unlimted unless I feel that sharp pain    Diagnostic tests MRI and [place] of right knee: patient states he was told medial meniscus but report is not visible yet.  MD notes state "sprain" and degenerative chondral changes.  Lumbar MRI states L4-S1 disc bulges and facet irritation.    Patient Stated Goals To be able to exercise and be active as well as do my job without having to compensate and worry about exacerbations of pain.  He explains that he really doesn't enjoy soccer that much, but his friends are always asking him to  play.    Currently in Pain? No/denies   Only when I am doing something strenuous at work where my knee gets into a twisted position.   Pain Orientation Right    Pain Score 0   0 during the day when he is up moving but still unable to sleep through the night- has to get up from bed and sleep on couch in more upright position.   Pain Location Back    Pain Orientation Lower    Pain Descriptors / Indicators Aching;Discomfort;Nagging    Pain Type Chronic pain    Pain Onset More than a month ago    Pain Frequency Intermittent    Aggravating Factors  sleeping    Pain Relieving Factors sleeping in a more upright flexed trunk position                [facility] PT Assessment - 10/10/21 0001       Assessment   Medical Diagnosis Chronic right knee pain: Low back pain without sciatica    Referring Provider (PT) Hulan Saas, DO    Onset Date/Surgical Date 12/19/20    Hand Dominance Right    Next MD Visit 11/02/21 3 pm    Prior Therapy no      AROM   Overall AROM Comments wnl throughout right knee and lumbar  spine    Right/Left Knee Right    Right Knee Extension --   wnl     Palpation   Palpation comment only mildly tender medial joint line      Special Tests    Special Tests Knee Special Tests    Knee Special tests  other   negative medial stress test     other    Findings Negative    Side  Right    Comments medial stress test      Ambulation/Gait   Gait Comments Normal gait                           [facility] Adult PT Treatment/Exercise - 10/10/21 0001       Lumbar Exercises: Standing   Other Standing Lumbar Exercises RDL's 0 lb single leg rigtht x 10, then added 5 lb kb x 10    Other Standing Lumbar Exercises Side stepping with red resistance loop down PT gym and back with red loop x3 laps in mini squat position      Knee/Hip Exercises: Machines for Strengthening   Total Gym Leg Press Seat 6: Bil 90# 2 x15 RTLE 60# 3x10 VC to slow eccentrics      Knee/Hip  Exercises: Standing   Forward Lunges Right;2 sets;10 reps    Forward Lunges Limitations onto BOSU with 10 lb dumbells    Side Lunges 2 sets;10 reps    Side Lunges Limitations onto BOSU with 10 lb dumbells    Rebounder right SLS on balance pad 3 way ball toss yellow ball      Knee/Hip Exercises: Supine   Other Supine Knee/Hip Exercises Hamstring curls on red physionball in bridge      Knee/Hip Exercises: Sidelying   Hip ABduction Strengthening;Right;2 sets;10 reps    Hip ABduction Limitations red loop    Clams red loop x 20      Knee/Hip Exercises: Prone   Hip Extension Strengthening;Right;2 sets;10 reps    Hip Extension Limitations red loop      Vasopneumatic   Number Minutes Vasopneumatic  10 minutes    Vasopnuematic Location  Knee    Vasopneumatic Pressure Medium    Vasopneumatic Temperature  3 flakes                     PT Education - 10/10/21 0934     Education Details Educated patient on how hamstring and hip flexor/quad tightness affects the low back.    Person(s) Educated Patient    Methods Explanation;Demonstration    Comprehension Verbalized understanding              PT Short Term Goals - 10/10/21 0941       PT SHORT TERM GOAL #1   Title Independence with initial HEP    Time 4    Period Weeks    Status Achieved    Target Date 10/12/21      PT SHORT TERM GOAL #2   Title No acute sharp pains in medial right knee that cause a locking episode    Time 4    Period Weeks    Status Achieved    Target Date 10/12/21               PT Long Term Goals - 10/10/21 0941       PT LONG TERM GOAL #1   Title Indpendence with advance HEP    Time 8  Period Weeks    Status Partially Met    Target Date 11/09/21      PT LONG TERM GOAL #2   Title Patient to avoid need for viscosupplementation or PRP injections    Time 8    Period Weeks    Status On-going    Target Date 11/09/21      PT LONG TERM GOAL #3   Title Patient to be able to bend,  stoop and squat to do jobs at work without pain    Time 8    Period Weeks    Status Partially Met    Target Date 11/09/21      PT LONG TERM GOAL #4   Title Patient to be able to resume higher level athletic activity and gym workouts    Time 8    Period Weeks    Status On-going      PT LONG TERM GOAL #5   Title FOTO score to be 76    Baseline 72    Time 8    Period Weeks    Status On-going    Target Date 11/09/21                   Plan - 10/10/21 0935     Clinical Impression Statement Patient is making steady progress toward goals for knee but continues to experience back pain that impedes his ability to sleep in bed.  He expains that it doesn't really bother him too much during the day.  He has less tenderness to palpation at the various areas of the knee that were significantly tender at initial eval.  He demonstrates improved stability on dynamic single leg activities.  He demonstrates some lack of core strength with bridge hamstring curls.  He would benefit from continued skilled PT to address core strength and low back pain while continuing with right knee stability training and flexibility issues.    Examination-Activity Limitations Lift;Squat    Examination-Participation Restrictions Occupation;Community Activity    Stability/Clinical Decision Making Stable/Uncomplicated    Clinical Decision Making Low    Rehab Potential Good    PT Frequency 2x / week    PT Duration 4 weeks    PT Treatment/Interventions ADLs/Self Care Home Management;Aquatic Therapy;Cryotherapy;Ultrasound;Moist Heat;Iontophoresis 76m/ml Dexamethasone;Electrical Stimulation;Gait training;Stair training;Functional mobility training;Neuromuscular re-education;Balance training;Therapeutic exercise;Therapeutic activities;Patient/family education;Orthotic Fit/Training;Manual techniques;Passive range of motion;Dry needling;Taping;Vasopneumatic Device;Joint Manipulations    PT Next Visit Plan Re-visit core  strength and flexibility to address low back issues.  Continue knee stability training.    PT Home Exercise Plan Access Code: [email or web address]    Consulted and Agree with Plan of Care Patient             Patient will benefit from skilled therapeutic intervention in order to improve the following deficits and impairments:  Abnormal gait, Decreased mobility, Difficulty walking, Increased muscle spasms, Decreased strength, Hypermobility, Impaired flexibility, Pain  Visit Diagnosis: Difficulty in walking, not elsewhere classified  Chronic bilateral low back pain without sciatica  Acute pain of right knee     Problem List Patient Active Problem List   Diagnosis Date Noted   Back pain 06/06/2021   Somatic dysfunction of spine, lumbar 06/06/2021   Somatic dysfunction of spine, sacral 06/06/2021   Knee pain 08/06/2020   Fatigue 12/07/2018   Low libido 12/07/2018    Yanet Balliet B. Kileigh Ortmann, PT 11/23/229:46 AM   [email or web address] [address]Phone: [phone]  Fax:  [phone]  Name: Manuel Barnes MRN: [id number] Date of Birth: [birth year]

## 2021-10-16 ENCOUNTER — Ambulatory Visit: Payer: 59

## 2021-10-16 ENCOUNTER — Other Ambulatory Visit: Payer: Self-pay

## 2021-10-16 DIAGNOSIS — G8929 Other chronic pain: Secondary | ICD-10-CM

## 2021-10-16 DIAGNOSIS — R262 Difficulty in walking, not elsewhere classified: Secondary | ICD-10-CM

## 2021-10-16 DIAGNOSIS — M25561 Pain in right knee: Secondary | ICD-10-CM

## 2021-10-16 DIAGNOSIS — M545 Low back pain, unspecified: Secondary | ICD-10-CM

## 2021-10-16 NOTE — Patient Instructions (Signed)
Revised HEP Access Code: [email or web address] URL: [email or web address] Date: 10/16/2021 Prepared by: Mikey Kirschner  Exercises Standing Hamstring Stretch on Chair - 2 x daily - 7 x weekly - 1 sets - 3 reps - 30 sec hold Standing Quad Stretch with Table and Chair Support - 2 x daily - 7 x weekly - 1 sets - 3 reps - 30 sec hold Side Stepping with Resistance at Ankles - 2 x daily - 7 x weekly - 3 sets - 10 reps Squat - 2 x daily - 7 x weekly - 1 sets - 20 reps Bridge with Hip Abduction and Resistance - Ground Touches - 2 x daily - 7 x weekly - 1 sets - 20 reps Seated Long Arc Quad - 1 x daily - 7 x weekly - 3 sets - 10 reps Hamstring Curl with Weight Machine - 1 x daily - 7 x weekly - 1 sets - 20 reps Full Leg Press - 1 x daily - 7 x weekly - 1 sets - 20 reps Knee Extension with Weight Machine - 1 x daily - 7 x weekly - 1 sets - 20 reps

## 2021-10-16 NOTE — Therapy (Signed)
[facility] @ [place], [place], [address] Phone: [phone]   Fax:  [phone]  Physical Therapy Treatment  Patient Details  Name: Manuel Barnes MRN: [id number] Date of Birth: 1986-01-09 Referring Provider (PT): Hulan Saas, DO   Encounter Date: 10/16/2021   PT End of Session - 10/16/21 1708     Visit Number 8    Date for PT Re-Evaluation 11/07/21    Authorization Type UMR    PT Start Time 1528    PT Stop Time 1615    PT Time Calculation (min) 47 min    Activity Tolerance Patient tolerated treatment well    Behavior During Therapy [facility] for tasks assessed/performed             History reviewed. No pertinent past medical history.  History reviewed. No pertinent surgical history.  There were no vitals filed for this visit.   Subjective Assessment - 10/16/21 1531     Subjective Patient states he is still being a little careful but he is not having any pain and he notices he rarely thinks about his knee during the day.  He states his back is "a little better".    Limitations Standing;Lifting    How long can you sit comfortably? unlimited    How long can you stand comfortably? 30 min    How long can you walk comfortably? unlimted no longer having sharp pain    Diagnostic tests MRI and [place] of right knee: patient states he was told medial meniscus but report is not visible yet.  MD notes state "sprain" and degenerative chondral changes.  Lumbar MRI states L4-S1 disc bulges and facet irritation.    Patient Stated Goals To be able to exercise and be active as well as do my job without having to compensate and worry about exacerbations of pain.  He explains that he really doesn't enjoy soccer that much, but his friends are always asking him to play.    Currently in Pain? No/denies    Pain Score 0-No pain    Pain Onset More than a month ago                               [facility]  Adult PT Treatment/Exercise - 10/16/21 0001       Knee/Hip Exercises: Machines for Strengthening   Total Gym Leg Press Seat 6: Bil 110# 2 x15 RTLE 65# 3x10 VC to slow eccentrics      Knee/Hip Exercises: Plyometrics   Other Plyometric Exercises BOSU ball push offs and land on single Right leg fwd and lateral    Other Plyometric Exercises BOSU ball push offs x 10 Right fwd and lateral      Knee/Hip Exercises: Standing   Functional Squat 2 sets;10 reps   also did bilateral with 15lb kb x 20 to 90 degrees of flexion   Functional Squat Limitations single leg to 2 balance pads on low table    Rebounder right SLS on balance pad 3 way ball toss yellow ball x 20 ea    Other Standing Knee Exercises Right LE RDL's 2 x 10 with 2 x 8 lbs    Other Standing Knee Exercises Yellow band side steps sink to coat rack 4x      Knee/Hip Exercises: Supine   Bridges with Clamshell Both;Strengthening;1 set;20 reps   yellow   Other Supine Knee/Hip Exercises Hamstring curls on  red physionball in bridge                     PT Education - 10/16/21 1707     Education Details Educated on proper activity and ok to dribble for soccer but no competitive play or hard kicks yet.    Person(s) Educated Patient    Methods Explanation    Comprehension Verbalized understanding              PT Short Term Goals - 10/10/21 0941       PT SHORT TERM GOAL #1   Title Independence with initial HEP    Time 4    Period Weeks    Status Achieved    Target Date 10/12/21      PT SHORT TERM GOAL #2   Title No acute sharp pains in medial right knee that cause a locking episode    Time 4    Period Weeks    Status Achieved    Target Date 10/12/21               PT Long Term Goals - 10/10/21 0941       PT LONG TERM GOAL #1   Title Indpendence with advance HEP    Time 8    Period Weeks    Status Partially Met    Target Date 11/09/21      PT LONG TERM GOAL #2   Title Patient to avoid need for  viscosupplementation or PRP injections    Time 8    Period Weeks    Status On-going    Target Date 11/09/21      PT LONG TERM GOAL #3   Title Patient to be able to bend, stoop and squat to do jobs at work without pain    Time 8    Period Weeks    Status Partially Met    Target Date 11/09/21      PT LONG TERM GOAL #4   Title Patient to be able to resume higher level athletic activity and gym workouts    Time 8    Period Weeks    Status On-going      PT LONG TERM GOAL #5   Title FOTO score to be 76    Baseline 72    Time 8    Period Weeks    Status On-going    Target Date 11/09/21                   Plan - 10/16/21 1708     Clinical Impression Statement Patient was able to do all tasks today including bilateral squat to 90 degrees and single leg RDL's without pain.  He should be ok to do some light jogging and dribbling soccer ball but no competitive play yet.  He is very compliant and well motivated.  He should continue to do well.  He would benefit from skilled PT to slowly transition to sport specific training and functional activity related to his work.    Examination-Activity Limitations Lift;Squat    Examination-Participation Restrictions Occupation;Community Activity    Rehab Potential Good    PT Frequency 2x / week    PT Duration 4 weeks    PT Treatment/Interventions ADLs/Self Care Home Management;Aquatic Therapy;Cryotherapy;Ultrasound;Moist Heat;Iontophoresis 6m/ml Dexamethasone;Electrical Stimulation;Gait training;Stair training;Functional mobility training;Neuromuscular re-education;Balance training;Therapeutic exercise;Therapeutic activities;Patient/family education;Orthotic Fit/Training;Manual techniques;Passive range of motion;Dry needling;Taping;Vasopneumatic Device;Joint Manipulations    PT Next Visit Plan Re-visit core strength and flexibility to address  low back issues.  Continue knee stability training.    PT Home Exercise Plan Access Code: [email or web address]     Consulted and Agree with Plan of Care Patient             Patient will benefit from skilled therapeutic intervention in order to improve the following deficits and impairments:  Abnormal gait, Decreased mobility, Difficulty walking, Increased muscle spasms, Decreased strength, Hypermobility, Impaired flexibility, Pain  Visit Diagnosis: Difficulty in walking, not elsewhere classified  Chronic bilateral low back pain without sciatica  Acute pain of right knee     Problem List Patient Active Problem List   Diagnosis Date Noted   Back pain 06/06/2021   Somatic dysfunction of spine, lumbar 06/06/2021   Somatic dysfunction of spine, sacral 06/06/2021   Knee pain 08/06/2020   Fatigue 12/07/2018   Low libido 12/07/2018    Jonn Chaikin B. Payden Docter, PT 11/29/225:13 PM   [facility] @ [place], [place], [address] Phone: [phone]   Fax:  [phone]  Name: Manuel Barnes MRN: [id number] Date of Birth: 02/19/1986

## 2021-10-17 ENCOUNTER — Ambulatory Visit: Payer: 59

## 2021-10-17 DIAGNOSIS — M25561 Pain in right knee: Secondary | ICD-10-CM

## 2021-10-17 DIAGNOSIS — M545 Low back pain, unspecified: Secondary | ICD-10-CM | POA: Diagnosis not present

## 2021-10-17 DIAGNOSIS — R262 Difficulty in walking, not elsewhere classified: Secondary | ICD-10-CM | POA: Diagnosis not present

## 2021-10-17 DIAGNOSIS — G8929 Other chronic pain: Secondary | ICD-10-CM

## 2021-10-17 NOTE — Patient Instructions (Signed)
Continue HEP 

## 2021-10-17 NOTE — Therapy (Signed)
[facility] @ [address] [place], [place], [address] Phone: [phone]   Fax:  [phone]  Physical Therapy Treatment  Patient Details  Name: Manuel Barnes MRN: [id number] Date of Birth: Feb 04, 1986 Referring Provider (PT): Hulan Saas, DO   Encounter Date: 10/17/2021   PT End of Session - 10/17/21 1653     Visit Number 9    Date for PT Re-Evaluation 11/07/21    Authorization Type UMR    PT Start Time 0277    PT Stop Time 1700    PT Time Calculation (min) 47 min    Activity Tolerance Patient tolerated treatment well    Behavior During Therapy [facility] for tasks assessed/performed             History reviewed. No pertinent past medical history.  History reviewed. No pertinent surgical history.  There were no vitals filed for this visit.   Subjective Assessment - 10/17/21 1617     Subjective Patient states he is doing fine.  No soreness from yesterdays session.    Limitations Standing;Lifting    How long can you sit comfortably? unlimited    How long can you stand comfortably? 30 min    How long can you walk comfortably? unlimted no longer having sharp pain    Diagnostic tests MRI and [place] of right knee: patient states he was told medial meniscus but report is not visible yet.  MD notes state "sprain" and degenerative chondral changes.  Lumbar MRI states L4-S1 disc bulges and facet irritation.    Patient Stated Goals To be able to exercise and be active as well as do my job without having to compensate and worry about exacerbations of pain.  He explains that he really doesn't enjoy soccer that much, but his friends are always asking him to play.    Pain Score 0    Pain Onset More than a month ago                               [facility] Adult PT Treatment/Exercise - 10/17/21 0001       Knee/Hip Exercises: Aerobic   Stationary Bike 5 min level 5      Knee/Hip Exercises: Machines for  Strengthening   Total Gym Leg Press Seat 6: Bil 120# 2 x15 RTLE 70# 3x10 VC to slow eccentrics    Other Machine Hurdles with red loop hop step 5 laps      Knee/Hip Exercises: Plyometrics   Other Plyometric Exercises BOSU ball push offs and land on single Right leg fwd and lateral    Other Plyometric Exercises BOSU ball push offs x 10 Right fwd and lateral      Knee/Hip Exercises: Standing   Functional Squat 2 sets;10 reps   also did bilateral with 15lb kb x 20 to 90 degrees of flexion   Functional Squat Limitations single leg to 2 balance pads on low table    Rebounder right SLS on balance pad 3 way ball toss blue ball x 20 ea    Other Standing Knee Exercises Right LE RDL's 2 x 10 with 2 x 8 lbs    Other Standing Knee Exercises Red band side steps sink to coat rack 4x      Knee/Hip Exercises: Supine   Single Leg Bridge Strengthening;Right;2 sets;Limitations   single leg right   Other Supine Knee/Hip Exercises Hamstring curls on red physionball in  bridge x 20      Vasopneumatic   Number Minutes Vasopneumatic  10 minutes    Vasopnuematic Location  Knee    Vasopneumatic Pressure Medium    Vasopneumatic Temperature  3 flakes                       PT Short Term Goals - 10/10/21 0941       PT SHORT TERM GOAL #1   Title Independence with initial HEP    Time 4    Period Weeks    Status Achieved    Target Date 10/12/21      PT SHORT TERM GOAL #2   Title No acute sharp pains in medial right knee that cause a locking episode    Time 4    Period Weeks    Status Achieved    Target Date 10/12/21               PT Long Term Goals - 10/10/21 0941       PT LONG TERM GOAL #1   Title Indpendence with advance HEP    Time 8    Period Weeks    Status Partially Met    Target Date 11/09/21      PT LONG TERM GOAL #2   Title Patient to avoid need for viscosupplementation or PRP injections    Time 8    Period Weeks    Status On-going    Target Date 11/09/21       PT LONG TERM GOAL #3   Title Patient to be able to bend, stoop and squat to do jobs at work without pain    Time 8    Period Weeks    Status Partially Met    Target Date 11/09/21      PT LONG TERM GOAL #4   Title Patient to be able to resume higher level athletic activity and gym workouts    Time 8    Period Weeks    Status On-going      PT LONG TERM GOAL #5   Title FOTO score to be 76    Baseline 72    Time 8    Period Weeks    Status On-going    Target Date 11/09/21                   Plan - 10/17/21 1653     Clinical Impression Statement Patient is progressing very well at this point. He is tolerating all activities without pain.  He continues to struggle with single leg dynamic balance activity but this is much improved this week.  He has not returned to running or working out at [employer].  He plans on beginning this after he finishes these last few sessions.    Examination-Activity Limitations Lift;Squat    Examination-Participation Restrictions Occupation;Community Activity    Stability/Clinical Decision Making Stable/Uncomplicated    Clinical Decision Making Low    Rehab Potential Good    PT Frequency 2x / week    PT Duration 4 weeks    PT Treatment/Interventions ADLs/Self Care Home Management;Aquatic Therapy;Cryotherapy;Ultrasound;Moist Heat;Iontophoresis 26m/ml Dexamethasone;Electrical Stimulation;Gait training;Stair training;Functional mobility training;Neuromuscular re-education;Balance training;Therapeutic exercise;Therapeutic activities;Patient/family education;Orthotic Fit/Training;Manual techniques;Passive range of motion;Dry needling;Taping;Vasopneumatic Device;Joint Manipulations    PT Next Visit Plan Re-visit core strength and flexibility to address low back issues.  Continue knee stability training.    PT Home Exercise Plan Access Code: [email or web address]    Consulted and Agree with  Plan of Care Patient             Patient will benefit from skilled  therapeutic intervention in order to improve the following deficits and impairments:  Abnormal gait, Decreased mobility, Difficulty walking, Increased muscle spasms, Decreased strength, Hypermobility, Impaired flexibility, Pain  Visit Diagnosis: Difficulty in walking, not elsewhere classified  Chronic bilateral low back pain without sciatica  Acute pain of right knee     Problem List Patient Active Problem List   Diagnosis Date Noted   Back pain 06/06/2021   Somatic dysfunction of spine, lumbar 06/06/2021   Somatic dysfunction of spine, sacral 06/06/2021   Knee pain 08/06/2020   Fatigue 12/07/2018   Low libido 12/07/2018    Manuel Barnes, PT 11/30/225:14 PM   [facility] @ [address] [place], [place], [address] Phone: [phone]   Fax:  [phone]  Name: Manuel Barnes MRN: [id number] Date of Birth: 09-07-1986

## 2021-10-23 ENCOUNTER — Ambulatory Visit: Payer: 59 | Attending: Family Medicine

## 2021-10-23 ENCOUNTER — Other Ambulatory Visit: Payer: Self-pay

## 2021-10-23 DIAGNOSIS — M545 Low back pain, unspecified: Secondary | ICD-10-CM | POA: Diagnosis not present

## 2021-10-23 DIAGNOSIS — M25561 Pain in right knee: Secondary | ICD-10-CM | POA: Insufficient documentation

## 2021-10-23 DIAGNOSIS — G8929 Other chronic pain: Secondary | ICD-10-CM | POA: Diagnosis not present

## 2021-10-23 DIAGNOSIS — R262 Difficulty in walking, not elsewhere classified: Secondary | ICD-10-CM | POA: Insufficient documentation

## 2021-10-23 NOTE — Patient Instructions (Signed)
Continue HEP.  Begin at gym anytime he is ready.

## 2021-10-23 NOTE — Therapy (Signed)
[facility] @ [place] [place], [place], [address] Phone: [phone]   Fax:  [phone]  Physical Therapy Treatment  Patient Details  Name: Manuel Barnes MRN: [id number] Date of Birth: 1986/05/20 Referring Provider (PT): Hulan Saas, DO   Encounter Date: 10/23/2021   PT End of Session - 10/23/21 1639     Visit Number 10    Date for PT Re-Evaluation 11/07/21    Authorization Type UMR    PT Start Time 1608    PT Stop Time 1700    PT Time Calculation (min) 52 min    Activity Tolerance Patient tolerated treatment well    Behavior During Therapy [facility] for tasks assessed/performed             History reviewed. No pertinent past medical history.  History reviewed. No pertinent surgical history.  There were no vitals filed for this visit.   Subjective Assessment - 10/23/21 1620     Subjective Denies any soreness or pain.  Able to squat down at work today for the first time in a while with no pain.    Limitations Standing;Lifting    How long can you sit comfortably? unlimited    How long can you stand comfortably? 30 min    How long can you walk comfortably? unlimted no longer having sharp pain    Diagnostic tests MRI and [place] of right knee: patient states he was told medial meniscus but report is not visible yet.  MD notes state "sprain" and degenerative chondral changes.  Lumbar MRI states L4-S1 disc bulges and facet irritation.    Patient Stated Goals To be able to exercise and be active as well as do my job without having to compensate and worry about exacerbations of pain.  He explains that he really doesn't enjoy soccer that much, but his friends are always asking him to play.    Currently in Pain? No/denies                               [facility] Adult PT Treatment/Exercise - 10/23/21 0001       Knee/Hip Exercises: Aerobic   Stationary Bike 5 min level 5      Knee/Hip Exercises:  Machines for Strengthening   Total Gym Leg Press Seat 6: Bil 120# 2 x15 RTLE 70# 3x10 VC to slow eccentrics    Other Machine Hurdles with red loop hop step 5 laps      Knee/Hip Exercises: Plyometrics   Other Plyometric Exercises BOSU ball push offs and land on single Right leg fwd and lateral    Other Plyometric Exercises BOSU ball push offs x 10 Right fwd and lateral      Knee/Hip Exercises: Standing   Functional Squat --   also did bilateral with 15lb kb x 20 to 90 degrees of flexion   Functional Squat Limitations single leg to 1 balance pads on low table    Rebounder right SLS on balance pad 3 way ball toss blue ball x 20 ea    Other Standing Knee Exercises Right LE RDL's 2 x 10 with 2 x 8 lbs    Other Standing Knee Exercises Red band side steps sink to coat rack 4x      Knee/Hip Exercises: Supine   Single Leg Bridge Strengthening;Right;2 sets;Limitations   single leg right   Other Supine Knee/Hip Exercises Hamstring curls on red physionball in  bridge x 20      Vasopneumatic   Number Minutes Vasopneumatic  10 minutes    Vasopnuematic Location  Knee    Vasopneumatic Pressure Medium    Vasopneumatic Temperature  3 flakes                       PT Short Term Goals - 10/10/21 0941       PT SHORT TERM GOAL #1   Title Independence with initial HEP    Time 4    Period Weeks    Status Achieved    Target Date 10/12/21      PT SHORT TERM GOAL #2   Title No acute sharp pains in medial right knee that cause a locking episode    Time 4    Period Weeks    Status Achieved    Target Date 10/12/21               PT Long Term Goals - 10/10/21 0941       PT LONG TERM GOAL #1   Title Indpendence with advance HEP    Time 8    Period Weeks    Status Partially Met    Target Date 11/09/21      PT LONG TERM GOAL #2   Title Patient to avoid need for viscosupplementation or PRP injections    Time 8    Period Weeks    Status On-going    Target Date 11/09/21       PT LONG TERM GOAL #3   Title Patient to be able to bend, stoop and squat to do jobs at work without pain    Time 8    Period Weeks    Status Partially Met    Target Date 11/09/21      PT LONG TERM GOAL #4   Title Patient to be able to resume higher level athletic activity and gym workouts    Time 8    Period Weeks    Status On-going      PT LONG TERM GOAL #5   Title FOTO score to be 76    Baseline 72    Time 8    Period Weeks    Status On-going    Target Date 11/09/21                   Plan - 10/23/21 1641     Clinical Impression Statement Patient continues to improve and tolerated increased resistance on leg press.  He is compliant and well motivated.  He would benefit from continuing until MD appt on 11-02-21.  We will likely DC at that time unless MD indicates otherwise.    Examination-Activity Limitations Lift;Squat    Examination-Participation Restrictions Occupation;Community Activity    Stability/Clinical Decision Making Stable/Uncomplicated    Clinical Decision Making Low    Rehab Potential Good    PT Frequency 2x / week    PT Duration 4 weeks    PT Treatment/Interventions ADLs/Self Care Home Management;Aquatic Therapy;Cryotherapy;Ultrasound;Moist Heat;Iontophoresis 48m/ml Dexamethasone;Electrical Stimulation;Gait training;Stair training;Functional mobility training;Neuromuscular re-education;Balance training;Therapeutic exercise;Therapeutic activities;Patient/family education;Orthotic Fit/Training;Manual techniques;Passive range of motion;Dry needling;Taping;Vasopneumatic Device;Joint Manipulations    PT Next Visit Plan Re-visit core strength and flexibility to address low back issues.  Continue knee stability training.    PT Home Exercise Plan Access Code: [email or web address]            Patient will benefit from skilled therapeutic intervention in order to improve the following  deficits and impairments:  Abnormal gait, Decreased mobility, Difficulty walking,  Increased muscle spasms, Decreased strength, Hypermobility, Impaired flexibility, Pain  Visit Diagnosis: Difficulty in walking, not elsewhere classified  Chronic bilateral low back pain without sciatica  Acute pain of right knee     Problem List Patient Active Problem List   Diagnosis Date Noted   Back pain 06/06/2021   Somatic dysfunction of spine, lumbar 06/06/2021   Somatic dysfunction of spine, sacral 06/06/2021   Knee pain 08/06/2020   Fatigue 12/07/2018   Low libido 12/07/2018    Janeth Terry B. Kortney Potvin, PT 12/06/224:52 PM   [facility] @ [address] [place], [place], [address] Phone: [phone]   Fax:  [phone]  Name: Carthel Castille MRN: [id number] Date of Birth: 1986-11-04

## 2021-10-25 ENCOUNTER — Other Ambulatory Visit: Payer: Self-pay

## 2021-10-25 ENCOUNTER — Ambulatory Visit: Payer: 59

## 2021-10-25 DIAGNOSIS — R262 Difficulty in walking, not elsewhere classified: Secondary | ICD-10-CM

## 2021-10-25 DIAGNOSIS — M25561 Pain in right knee: Secondary | ICD-10-CM | POA: Diagnosis not present

## 2021-10-25 DIAGNOSIS — G8929 Other chronic pain: Secondary | ICD-10-CM | POA: Diagnosis not present

## 2021-10-25 DIAGNOSIS — M545 Low back pain, unspecified: Secondary | ICD-10-CM

## 2021-10-25 NOTE — Therapy (Signed)
[facility] [place] @ [address] [place], [place], [address] Phone: [phone]   Fax:  [phone]  Physical Therapy Treatment  Patient Details  Name: Manuel Barnes MRN: [id number] Date of Birth: 08-14-1986 Referring Provider (PT): Hulan Saas, DO   Encounter Date: 10/25/2021   PT End of Session - 10/25/21 1641     Visit Number 11    Date for PT Re-Evaluation 11/07/21    Authorization Type UMR    PT Start Time 1615    PT Stop Time 1700    PT Time Calculation (min) 45 min    Activity Tolerance Patient tolerated treatment well    Behavior During Therapy [facility] for tasks assessed/performed             History reviewed. No pertinent past medical history.  History reviewed. No pertinent surgical history.  There were no vitals filed for this visit.   Subjective Assessment - 10/25/21 1617     Subjective Patient states he had a twinge of pain in the right knee after rocking his daughter in the recliner.  He denies any pain today but he has felt some popping when he bends his knee or squats.    Limitations Standing;Lifting    How long can you sit comfortably? unlimited    How long can you stand comfortably? 30 min    How long can you walk comfortably? unlimted no longer having sharp pain    Diagnostic tests MRI and [place] of right knee: patient states he was told medial meniscus but report is not visible yet.  MD notes state "sprain" and degenerative chondral changes.  Lumbar MRI states L4-S1 disc bulges and facet irritation.    Patient Stated Goals To be able to exercise and be active as well as do my job without having to compensate and worry about exacerbations of pain.  He explains that he really doesn't enjoy soccer that much, but his friends are always asking him to play.    Currently in Pain? No/denies    Pain Onset More than a month ago    Pain Onset More than a month ago                                [facility] Adult PT Treatment/Exercise - 10/25/21 0001       Knee/Hip Exercises: Aerobic   Stationary Bike 5 min level 5      Knee/Hip Exercises: Machines for Strengthening   Total Gym Leg Press Seat 6: Bil 120# 2 x15 RTLE 70# 3x10 VC to slow eccentrics    Other Machine Hurdles with red loop hop step 5 laps      Knee/Hip Exercises: Plyometrics   Other Plyometric Exercises BOSU ball push offs and land on single Right leg fwd and lateral    Other Plyometric Exercises BOSU ball push offs x 10 Right fwd and lateral      Knee/Hip Exercises: Standing   Functional Squat --   also did bilateral with 15lb kb x 20 to 90 degrees of flexion   Functional Squat Limitations single leg to 1 balance pads on low table    Rebounder right SLS on balance pad 3 way ball toss blue ball x 20 ea    Other Standing Knee Exercises Right LE RDL's 2 x 10 with 2 x 8 lbs    Other Standing Knee Exercises Red band side steps sink to  coat rack 4x      Knee/Hip Exercises: Supine   Single Leg Bridge Strengthening;Right;2 sets;Limitations   single leg right   Other Supine Knee/Hip Exercises Hamstring curls on red physionball in bridge x 20      Vasopneumatic   Number Minutes Vasopneumatic  10 minutes    Vasopnuematic Location  Knee    Vasopneumatic Pressure Medium    Vasopneumatic Temperature  3 flakes                       PT Short Term Goals - 10/10/21 0941       PT SHORT TERM GOAL #1   Title Independence with initial HEP    Time 4    Period Weeks    Status Achieved    Target Date 10/12/21      PT SHORT TERM GOAL #2   Title No acute sharp pains in medial right knee that cause a locking episode    Time 4    Period Weeks    Status Achieved    Target Date 10/12/21               PT Long Term Goals - 10/10/21 0941       PT LONG TERM GOAL #1   Title Indpendence with advance HEP    Time 8    Period Weeks    Status Partially Met    Target Date  11/09/21      PT LONG TERM GOAL #2   Title Patient to avoid need for viscosupplementation or PRP injections    Time 8    Period Weeks    Status On-going    Target Date 11/09/21      PT LONG TERM GOAL #3   Title Patient to be able to bend, stoop and squat to do jobs at work without pain    Time 8    Period Weeks    Status Partially Met    Target Date 11/09/21      PT LONG TERM GOAL #4   Title Patient to be able to resume higher level athletic activity and gym workouts    Time 8    Period Weeks    Status On-going      PT LONG TERM GOAL #5   Title FOTO score to be 76    Baseline 72    Time 8    Period Weeks    Status On-going    Target Date 11/09/21                   Plan - 10/25/21 1642     Clinical Impression Statement Despite having a twinge of pain at home while rocking his daughter in [place], he did not have any episodes of pain with his higher level program in physical therapy today.  He is progressing appropriately.  He should continue to do well.    Examination-Activity Limitations Lift;Squat    Examination-Participation Restrictions Occupation;Community Activity    Stability/Clinical Decision Making Stable/Uncomplicated    Clinical Decision Making Low    Rehab Potential Good    PT Frequency 2x / week    PT Duration 4 weeks    PT Treatment/Interventions ADLs/Self Care Home Management;Aquatic Therapy;Cryotherapy;Ultrasound;Moist Heat;Iontophoresis 44m/ml Dexamethasone;Electrical Stimulation;Gait training;Stair training;Functional mobility training;Neuromuscular re-education;Balance training;Therapeutic exercise;Therapeutic activities;Patient/family education;Orthotic Fit/Training;Manual techniques;Passive range of motion;Dry needling;Taping;Vasopneumatic Device;Joint Manipulations    PT Next Visit Plan Continue right LE strengthening and re-visit core stabilization.    PT Home  Exercise Plan Access Code: [email or web address]    Consulted and Agree with Plan of Care  Patient             Patient will benefit from skilled therapeutic intervention in order to improve the following deficits and impairments:  Abnormal gait, Decreased mobility, Difficulty walking, Increased muscle spasms, Decreased strength, Hypermobility, Impaired flexibility, Pain  Visit Diagnosis: Difficulty in walking, not elsewhere classified  Chronic bilateral low back pain without sciatica  Acute pain of right knee     Problem List Patient Active Problem List   Diagnosis Date Noted   Back pain 06/06/2021   Somatic dysfunction of spine, lumbar 06/06/2021   Somatic dysfunction of spine, sacral 06/06/2021   Knee pain 08/06/2020   Fatigue 12/07/2018   Low libido 12/07/2018    Manuel Barnes, PT 12/08/224:58 PM   [facility] @ [place], [place], [address] Phone: [phone]   Fax:  [phone]  Name: Manuel Barnes MRN: [id number] Date of Birth: 10-18-1986

## 2021-11-01 ENCOUNTER — Other Ambulatory Visit: Payer: Self-pay

## 2021-11-01 ENCOUNTER — Ambulatory Visit: Payer: 59

## 2021-11-01 DIAGNOSIS — M25561 Pain in right knee: Secondary | ICD-10-CM

## 2021-11-01 DIAGNOSIS — R262 Difficulty in walking, not elsewhere classified: Secondary | ICD-10-CM | POA: Diagnosis not present

## 2021-11-01 DIAGNOSIS — G8929 Other chronic pain: Secondary | ICD-10-CM

## 2021-11-01 DIAGNOSIS — M545 Low back pain, unspecified: Secondary | ICD-10-CM | POA: Diagnosis not present

## 2021-11-01 NOTE — Therapy (Signed)
[facility] @ [place], [place], [address] Phone: [phone]   Fax:  [phone]  Physical Therapy Treatment  Patient Details  Name: Manuel Barnes MRN: [id number] Date of Birth: 1986-06-21 Referring Provider (PT): Hulan Saas, DO   Encounter Date: 11/01/2021   PT End of Session - 11/01/21 1729     Visit Number 12    Date for PT Re-Evaluation 11/07/21    Authorization Type UMR    PT Start Time 1615    PT Stop Time 1710    PT Time Calculation (min) 55 min    Activity Tolerance Patient tolerated treatment well    Behavior During Therapy [facility] for tasks assessed/performed             History reviewed. No pertinent past medical history.  History reviewed. No pertinent surgical history.  There were no vitals filed for this visit.   Subjective Assessment - 11/01/21 1623     Subjective Patient denies any problems since last visit.  He denies any pain.    Limitations Standing;Lifting    How long can you sit comfortably? unlimited    How long can you stand comfortably? 30 min    How long can you walk comfortably? unlimted no longer having sharp pain    Diagnostic tests MRI and [place] of right knee: patient states he was told medial meniscus but report is not visible yet.  MD notes state "sprain" and degenerative chondral changes.  Lumbar MRI states L4-S1 disc bulges and facet irritation.    Patient Stated Goals To be able to exercise and be active as well as do my job without having to compensate and worry about exacerbations of pain.  He explains that he really doesn't enjoy soccer that much, but his friends are always asking him to play.    Currently in Pain? No/denies    Pain Onset More than a month ago    Pain Onset More than a month ago                               [facility] Adult PT Treatment/Exercise - 11/01/21 0001       Knee/Hip Exercises: Aerobic   Stationary Bike 5 min  level 5      Knee/Hip Exercises: Machines for Strengthening   Total Gym Leg Press Seat 6: Bil 130# 2 x15 RTLE 70# 3x10 VC to slow eccentrics    Other Machine Hurdles with red loop hop step 5 laps      Knee/Hip Exercises: Plyometrics   Broad Jump 2 sets;5 reps    Other Plyometric Exercises BOSU ball push offs and land on single Right leg fwd and lateral    Other Plyometric Exercises BOSU ball push offs x 10 Right fwd and lateral      Knee/Hip Exercises: Standing   Functional Squat 2 sets;10 reps   [place] split squat with 2 x 8 lbs right foot back   Functional Squat Limitations single leg (right) tto 1 balance pads on low table   added 2 x 8 lb dumbells   Wall Squat 20 reps   BOSU Squats   Wall Squat Limitations BOSU squats    Rebounder right SLS on balance pad 3 way ball toss blue ball x 20 ea    Other Standing Knee Exercises Right LE RDL's 2 x 10 with 2 x 8 lbs  Knee/Hip Exercises: Supine   Single Leg Bridge Strengthening;Right;2 sets;Limitations   single leg right   Other Supine Knee/Hip Exercises Hamstring curls on red physionball in bridge x 20      Vasopneumatic   Number Minutes Vasopneumatic  10 minutes    Vasopnuematic Location  Knee    Vasopneumatic Pressure Medium    Vasopneumatic Temperature  3 flakes                       PT Short Term Goals - 10/10/21 0941       PT SHORT TERM GOAL #1   Title Independence with initial HEP    Time 4    Period Weeks    Status Achieved    Target Date 10/12/21      PT SHORT TERM GOAL #2   Title No acute sharp pains in medial right knee that cause a locking episode    Time 4    Period Weeks    Status Achieved    Target Date 10/12/21               PT Long Term Goals - 10/10/21 0941       PT LONG TERM GOAL #1   Title Indpendence with advance HEP    Time 8    Period Weeks    Status Partially Met    Target Date 11/09/21      PT LONG TERM GOAL #2   Title Patient to avoid need for  viscosupplementation or PRP injections    Time 8    Period Weeks    Status On-going    Target Date 11/09/21      PT LONG TERM GOAL #3   Title Patient to be able to bend, stoop and squat to do jobs at work without pain    Time 8    Period Weeks    Status Partially Met    Target Date 11/09/21      PT LONG TERM GOAL #4   Title Patient to be able to resume higher level athletic activity and gym workouts    Time 8    Period Weeks    Status On-going      PT LONG TERM GOAL #5   Title FOTO score to be 76    Baseline 72    Time 8    Period Weeks    Status On-going    Target Date 11/09/21                   Plan - 11/01/21 1730     Clinical Impression Statement Pamela was able to tolerate addition of split squats and broad jump today without recurrance of pain.  He is tolerating a high level program and demonstrates good quad activity, alignment and stability.  He will see MD tomorrow for f/u.  We will DC next visit if MD agrees.    Examination-Activity Limitations Lift;Squat    Examination-Participation Restrictions Occupation;Community Activity    Stability/Clinical Decision Making Stable/Uncomplicated    Clinical Decision Making Low    Rehab Potential Good    PT Frequency 2x / week    PT Duration 4 weeks    PT Treatment/Interventions ADLs/Self Care Home Management;Aquatic Therapy;Cryotherapy;Ultrasound;Moist Heat;Iontophoresis 42m/ml Dexamethasone;Electrical Stimulation;Gait training;Stair training;Functional mobility training;Neuromuscular re-education;Balance training;Therapeutic exercise;Therapeutic activities;Patient/family education;Orthotic Fit/Training;Manual techniques;Passive range of motion;Dry needling;Taping;Vasopneumatic Device;Joint Manipulations    PT Next Visit Plan DC  unless MD indicates otherwise.    PT Home Exercise Plan Access  Code: [email or web address]    Consulted and Agree with Plan of Care Patient             Patient will benefit from skilled  therapeutic intervention in order to improve the following deficits and impairments:  Abnormal gait, Decreased mobility, Difficulty walking, Increased muscle spasms, Decreased strength, Hypermobility, Impaired flexibility, Pain  Visit Diagnosis: Difficulty in walking, not elsewhere classified  Chronic bilateral low back pain without sciatica  Acute pain of right knee     Problem List Patient Active Problem List   Diagnosis Date Noted   Back pain 06/06/2021   Somatic dysfunction of spine, lumbar 06/06/2021   Somatic dysfunction of spine, sacral 06/06/2021   Knee pain 08/06/2020   Fatigue 12/07/2018   Low libido 12/07/2018    Eddis Pingleton B. Miklo Aken, PT 12/15/225:38 PM   [facility] @ [place], [place], [address] Phone: [phone]   Fax:  [phone]  Name: Qais Jowers MRN: [id number] Date of Birth: [birth year]

## 2021-11-01 NOTE — Patient Instructions (Signed)
Patient will see Antoine Primas, DO for follow up tomorrow.  He has one visit scheduled next week.  We will DC at that time if MD agrees.  Instructed patient to share goals of return to soccer with MD.

## 2021-11-01 NOTE — Progress Notes (Signed)
[facility] [address] [place] [address] Phone: [phone] Subjective:   Manuel Barnes, am serving as a scribe for Dr. Antoine Primas. This visit occurred during the SARS-CoV-2 public health emergency.  Safety protocols were in place, including screening questions prior to the visit, additional usage of staff PPE, and extensive cleaning of exam room while observing appropriate contact time as indicated for disinfecting solutions.  I'm seeing this patient by the request  of:  Bing Neighbors, FNP  CC: Knee pain, back pain follow-up  INO:[id number]  09/13/2021 Back exam and does want an MRI have been unfortunately more for inguinal stenosis then anticipated as well as a right L5 nerve root.  Discussed with patient that we could potentially consider nerve root injection instead of an epidural.  At this point though patient would like to see how the knee responds to the injection.  Depending on this patient will follow-up with me afterwards.  Encourage patient to do physical therapy which he is scheduled for tomorrow.  Patient's MRI of the knee showed that patient did have some mild degenerative mild sprain patient does have what appears to be some very subtle focal chondral edema noted and mild synovitis.  Injection given today to further evaluate and see how patient responds.  We will get approval for the chondral edema such as the viscosupplementation could be helpful.  Did discuss potential PRP but do not see anything that would need any surgical intervention.  Patient is agreement with the plan and will follow up with me again in 4 to 8 weeks.  Updated 11/02/2021 Manuel Barnes is a 35 y.o. male coming in with complaint of back and R knee pain. Patient states that he has pain at night in lower back but feels improvement throughout the day.   Knee pain has subsided. Does feel lhis knee aches with cold weather though.        No past  medical history on file. No past surgical history on file. Social History   Socioeconomic History   Marital status: Married    Spouse name: Not on file   Number of children: Not on file   Years of education: Not on file   Highest education level: Not on file  Occupational History   Not on file  Tobacco Use   Smoking status: Never   Smokeless tobacco: Never  Substance and Sexual Activity   Alcohol use: Not on file   Drug use: Not on file   Sexual activity: Not on file  Other Topics Concern   Not on file  Social History Narrative   Not on file   Social Determinants of Health   Financial Resource Strain: Not on file  Food Insecurity: Not on file  Transportation Needs: Not on file  Physical Activity: Not on file  Stress: Not on file  Social Connections: Not on file   No Known Allergies Family History  Problem Relation Age of Onset   Diabetes Father    Hyperlipidemia Father    Diabetes Paternal Uncle    Hyperlipidemia Paternal Uncle    Diabetes Paternal Grandmother    Hyperlipidemia Paternal Grandmother    Diabetes Paternal Grandfather    Hyperlipidemia Paternal Grandfather    Stroke Neg Hx    No current outpatient medications on file.   Reviewed prior external information including notes and imaging from  primary care provider As well as notes that were available from care everywhere and other healthcare  systems.  Past medical history, social, surgical and family history all reviewed in electronic medical record.  No pertanent information unless stated regarding to the chief complaint.   Review of Systems:  No headache, visual changes, nausea, vomiting, diarrhea, constipation, dizziness, abdominal pain, skin rash, fevers, chills, night sweats, weight loss, swollen lymph nodes, body aches, joint swelling, chest pain, shortness of breath, mood changes. POSITIVE muscle aches but only at night  Objective  Blood pressure 110/76, pulse 96, height 5\' 6"  (1.676 m),  weight 166 lb (75.3 kg), SpO2 98 %.   General: No apparent distress alert and oriented x3 mood and affect normal, dressed appropriately.  HEENT: Pupils equal, extraocular movements intact  Respiratory: Patient's speak in full sentences and does not appear short of breath  Cardiovascular: No lower extremity edema, non tender, no erythema  Gait normal with good balance and coordination.  MSK: Right knee exam is nontender on exam.  Full range of motion at the moment.  Patient is sitting comfortably in the chair. Impression and Recommendations:     The above documentation has been reviewed and is accurate and complete , DO

## 2021-11-02 ENCOUNTER — Ambulatory Visit (INDEPENDENT_AMBULATORY_CARE_PROVIDER_SITE_OTHER): Payer: 59 | Admitting: Family Medicine

## 2021-11-02 ENCOUNTER — Other Ambulatory Visit: Payer: Self-pay

## 2021-11-02 ENCOUNTER — Encounter: Payer: Self-pay | Admitting: Family Medicine

## 2021-11-02 DIAGNOSIS — G8929 Other chronic pain: Secondary | ICD-10-CM | POA: Diagnosis not present

## 2021-11-02 DIAGNOSIS — M545 Low back pain, unspecified: Secondary | ICD-10-CM | POA: Diagnosis not present

## 2021-11-02 DIAGNOSIS — M25561 Pain in right knee: Secondary | ICD-10-CM

## 2021-11-02 NOTE — Assessment & Plan Note (Signed)
Discussed with patient about the possibility of gabapentin.  Patient declined it at the moment but will consider it.  Patient was accompanied with significant other.  Discussed with patient about the possibilities of the medication and how that could be more beneficial.  Follow-up with me again as needed otherwise.

## 2021-11-02 NOTE — Patient Instructions (Signed)
Good to see you! Enjoy the game See me when you need me  Gabapentin Capsules or Tablets What is this medication? GABAPENTIN (GA ba pen tin) treats nerve pain. It may also be used to prevent and control seizures in people with epilepsy. It works by calming overactive nerves in your body. This medicine may be used for other purposes; ask your health care provider or pharmacist if you have questions. COMMON BRAND NAME(S): Active-PAC with Gabapentin, Ascencion Dike, Gralise, Neurontin What should I tell my care team before I take this medication? They need to know if you have any of these conditions: Alcohol or substance use disorder Kidney disease Lung or breathing disease Suicidal thoughts, plans, or attempt; a previous suicide attempt by you or a family member An unusual or allergic reaction to gabapentin, other medications, foods, dyes, or preservatives Pregnant or trying to get pregnant Breast-feeding How should I use this medication? Take this medication by mouth with a glass of water. Follow the directions on the prescription label. You can take it with or without food. If it upsets your stomach, take it with food. Take your medication at regular intervals. Do not take it more often than directed. Do not stop taking except on your care team's advice. If you are directed to break the 600 or 800 mg tablets in half as part of your dose, the extra half tablet should be used for the next dose. If you have not used the extra half tablet within 28 days, it should be thrown away. A special MedGuide will be given to you by the pharmacist with each prescription and refill. Be sure to read this information carefully each time. Talk to your care team about the use of this medication in children. While this medication may be prescribed for children as young as 3 years for selected conditions, precautions do apply. Overdosage: If you think you have taken too much of this medicine contact a poison control  center or emergency room at once. NOTE: This medicine is only for you. Do not share this medicine with others. What if I miss a dose? If you miss a dose, take it as soon as you can. If it is almost time for your next dose, take only that dose. Do not take double or extra doses. What may interact with this medication? Alcohol Antihistamines for allergy, cough, and cold Certain medications for anxiety or sleep Certain medications for depression like amitriptyline, fluoxetine, sertraline Certain medications for seizures like phenobarbital, primidone Certain medications for stomach problems General anesthetics like halothane, isoflurane, methoxyflurane, propofol Local anesthetics like lidocaine, pramoxine, tetracaine Medications that relax muscles for surgery Opioid medications for pain Phenothiazines like chlorpromazine, mesoridazine, prochlorperazine, thioridazine This list may not describe all possible interactions. Give your health care provider a list of all the medicines, herbs, non-prescription drugs, or dietary supplements you use. Also tell them if you smoke, drink alcohol, or use illegal drugs. Some items may interact with your medicine. What should I watch for while using this medication? Visit your care team for regular checks on your progress. You may want to keep a record at home of how you feel your condition is responding to treatment. You may want to share this information with your care team at each visit. You should contact your care team if your seizures get worse or if you have any new types of seizures. Do not stop taking this medication or any of your seizure medications unless instructed by your care team. Stopping your medication  suddenly can increase your seizures or their severity. This medication may cause serious skin reactions. They can happen weeks to months after starting the medication. Contact your care team right away if you notice fevers or flu-like symptoms with a  rash. The rash may be red or purple and then turn into blisters or peeling of the skin. Or, you might notice a red rash with swelling of the face, lips or lymph nodes in your neck or under your arms. Wear a medical identification bracelet or chain if you are taking this medication for seizures. Carry a card that lists all your medications. This medication may affect your coordination, reaction time, or judgment. Do not drive or operate machinery until you know how this medication affects you. Sit up or stand slowly to reduce the risk of dizzy or fainting spells. Drinking alcohol with this medication can increase the risk of these side effects. Your mouth may get dry. Chewing sugarless gum or sucking hard candy, and drinking plenty of water may help. Watch for new or worsening thoughts of suicide or depression. This includes sudden changes in mood, behaviors, or thoughts. These changes can happen at any time but are more common in the beginning of treatment or after a change in dose. Call your care team right away if you experience these thoughts or worsening depression. If you become pregnant while using this medication, you may enroll in the [place] American Antiepileptic Drug Pregnancy Registry by calling [id number]. This registry collects information about the safety of antiepileptic medication use during pregnancy. What side effects may I notice from receiving this medication? Side effects that you should report to your care team as soon as possible: Allergic reactions or angioedema--skin rash, itching, hives, swelling of the face, eyes, lips, tongue, arms, or legs, trouble swallowing or breathing Rash, fever, and swollen lymph nodes Thoughts of suicide or self harm, worsening mood, feelings of depression Trouble breathing Unusual changes in mood or behavior in children after use such as difficulty concentrating, hostility, or restlessness Side effects that usually do not require medical attention  (report to your care team if they continue or are bothersome): Dizziness Drowsiness Nausea Swelling of ankles, feet, or hands Vomiting This list may not describe all possible side effects. Call your doctor for medical advice about side effects. You may report side effects to FDA at 1-800-FDA-1088. Where should I keep my medication? Keep out of reach of children and pets. Store at room temperature between 15 and 30 degrees C (59 and 86 degrees F). Get rid of any unused medication after the expiration date. This medication may cause accidental overdose and death if taken by other adults, children, or pets. To get rid of medications that are no longer needed or have expired: Take the medication to a medication take-back program. Check with your pharmacy or law enforcement to find a location. If you cannot return the medication, check the label or package insert to see if the medication should be thrown out in the garbage or flushed down the toilet. If you are not sure, ask your care team. If it is safe to put it in the trash, empty the medication out of the container. Mix the medication with cat litter, dirt, coffee grounds, or other unwanted substance. Seal the mixture in a bag or container. Put it in the trash. NOTE: This sheet is a summary. It may not cover all possible information. If you have questions about this medicine, talk to your doctor, pharmacist, or health care  provider.  2022 Elsevier/Gold Standard (2021-05-07 00:00:00)

## 2021-11-02 NOTE — Assessment & Plan Note (Signed)
Patient is doing relatively well overall.  No significant change in management.  Worsening pain can consider the possibility of viscosupplementation but I think patient will do okay.

## 2021-11-07 ENCOUNTER — Ambulatory Visit: Payer: 59

## 2021-11-07 ENCOUNTER — Other Ambulatory Visit: Payer: Self-pay

## 2021-11-07 DIAGNOSIS — G8929 Other chronic pain: Secondary | ICD-10-CM

## 2021-11-07 DIAGNOSIS — M25561 Pain in right knee: Secondary | ICD-10-CM

## 2021-11-07 DIAGNOSIS — M545 Low back pain, unspecified: Secondary | ICD-10-CM | POA: Diagnosis not present

## 2021-11-07 DIAGNOSIS — R262 Difficulty in walking, not elsewhere classified: Secondary | ICD-10-CM

## 2021-11-07 NOTE — Patient Instructions (Signed)
Spent majority of session going over appropriate gym activities and reviewing HEP.  Instructed patient on machines he could do at the gym that would address his weak areas.

## 2021-11-07 NOTE — Therapy (Addendum)
[facility] @ [facility] [address] [place], [place], [address] Phone: [phone]   Fax:  [phone]  Physical Therapy Discharge  Patient Details  Name: Manuel Barnes MRN: [id number] Date of Birth: Oct 29, 1986 Referring Provider (PT): Antoine Primas, DO  Patient did not return after his visit on 11/07/21, we will DC at this time.    Encounter Date: 11/07/2021   PT End of Session - 11/07/21 1732     Visit Number 13    Date for PT Re-Evaluation 11/16/21    Authorization Type UMR    PT Start Time 1615    PT Stop Time 1700    PT Time Calculation (min) 45 min    Activity Tolerance Patient tolerated treatment well    Behavior During Therapy [facility] for tasks assessed/performed             History reviewed. No pertinent past medical history.  History reviewed. No pertinent surgical history.  There were no vitals filed for this visit.   Subjective Assessment - 11/07/21 1629     Subjective Saw MD.  MD feels he is doing well and can begin with gym routine once PT is completed.  Patient reports mild medial knee pain on right.    Limitations Standing;Lifting    How long can you sit comfortably? unlimited    How long can you stand comfortably? 30 min    How long can you walk comfortably? unlimted no longer having sharp pain    Diagnostic tests MRI and [place] of right knee: patient states he was told medial meniscus but report is not visible yet.  MD notes state "sprain" and degenerative chondral changes.  Lumbar MRI states L4-S1 disc bulges and facet irritation.    Patient Stated Goals To be able to exercise and be active as well as do my job without having to compensate and worry about exacerbations of pain.  He explains that he really doesn't enjoy soccer that much, but his friends are always asking him to play.    Pain Onset More than a month ago                [facility] PT Assessment - 11/07/21 0001       Assessment    Medical Diagnosis Chronic right knee pain: Low back pain without sciatica    Referring Provider (PT) Antoine Primas, DO    Onset Date/Surgical Date 12/19/20    Hand Dominance Right    Next MD Visit 11/02/21 3 pm    Prior Therapy no      ROM / Strength   AROM / PROM / Strength Strength      AROM   Overall AROM Comments wnl throughout right knee and lumbar spine    Right/Left Knee Right    Right Knee Extension --   wnl     Strength   Overall Strength Within functional limits for tasks performed      Palpation   Palpation comment only mildly tender medial joint line      Special Tests    Special Tests Knee Special Tests    Knee Special tests  other   negative medial stress test     other    Findings Negative    Side  Right    Comments medial stress test      Ambulation/Gait   Gait Comments Normal gait  [facility] Adult PT Treatment/Exercise - 11/07/21 0001       Knee/Hip Exercises: Aerobic   Stationary Bike 5 min level 5      Knee/Hip Exercises: Machines for Strengthening   Total Gym Leg Press Seat 6: Bil 130# 2 x15 RTLE 70# 3x10 VC to slow eccentrics      Knee/Hip Exercises: Standing   Functional Squat 2 sets;10 reps   [place] split squat with 2 x 8 lbs right foot back   Other Standing Knee Exercises Right LE RDL's 2 x 10 with 2 x 8 lbs      Knee/Hip Exercises: Supine   Other Supine Knee/Hip Exercises Hamstring curls on red physionball in bridge x 20                       PT Short Term Goals - 10/10/21 0941       PT SHORT TERM GOAL #1   Title Independence with initial HEP    Time 4    Period Weeks    Status Achieved    Target Date 10/12/21      PT SHORT TERM GOAL #2   Title No acute sharp pains in medial right knee that cause a locking episode    Time 4    Period Weeks    Status Achieved    Target Date 10/12/21               PT Long Term Goals - 11/07/21 1740       PT LONG TERM GOAL #1    Title Indpendence with advance HEP    Time 8    Period Weeks    Status Partially Met    Target Date 11/09/21      PT LONG TERM GOAL #2   Title Patient to avoid need for viscosupplementation or PRP injections    Time 8    Period Weeks    Status Achieved    Target Date 11/09/21      PT LONG TERM GOAL #3   Title Patient to be able to bend, stoop and squat to do jobs at work without pain    Time 8    Period Weeks    Status Achieved    Target Date 11/09/21      PT LONG TERM GOAL #4   Title Patient to be able to resume higher level athletic activity and gym workouts    Time 8    Period Weeks    Status On-going    Target Date 11/09/21      PT LONG TERM GOAL #5   Title FOTO score to be 76    Baseline 72    Time 8    Period Weeks    Status On-going    Target Date 11/09/21                   Plan - 11/07/21 1736     Clinical Impression Statement Patient met with MD who cleared him to begin at his local fitness facility.  He has met 90% of his goals but would like to continue through end of year to be sure he is well educated on his gym routine.  He had some mild tenderness medial joint line today but was able to do all tasks without increased pain or symptoms.    Examination-Activity Limitations Lift;Squat    Examination-Participation Restrictions Occupation;Community Activity    Stability/Clinical Decision Making Stable/Uncomplicated    Clinical Decision  Making Low    Rehab Potential Good    PT Frequency 2x / week    PT Duration Other (comment)   1 more week   PT Treatment/Interventions ADLs/Self Care Home Management;Aquatic Therapy;Cryotherapy;Ultrasound;Moist Heat;Iontophoresis 4mg /ml Dexamethasone;Electrical Stimulation;Gait training;Stair training;Functional mobility training;Neuromuscular re-education;Balance training;Therapeutic exercise;Therapeutic activities;Patient/family education;Orthotic Fit/Training;Manual techniques;Passive range of motion;Dry  needling;Taping;Vasopneumatic Device;Joint Manipulations    PT Next Visit Plan Continue 1 more week to insure patient fully knowledgeable about gym routine and how to progress.  DC on 11/16/21.    PT Home Exercise Plan Access Code: [email or web address]    Consulted and Agree with Plan of Care Patient             Patient will benefit from skilled therapeutic intervention in order to improve the following deficits and impairments:  Abnormal gait, Decreased mobility, Difficulty walking, Increased muscle spasms, Decreased strength, Hypermobility, Impaired flexibility, Pain  Visit Diagnosis: Difficulty in walking, not elsewhere classified  Chronic bilateral low back pain without sciatica  Acute pain of right knee     Problem List Patient Active Problem List   Diagnosis Date Noted   Back pain 06/06/2021   Somatic dysfunction of spine, lumbar 06/06/2021   Somatic dysfunction of spine, sacral 06/06/2021   Knee pain 08/06/2020   Fatigue 12/07/2018   Low libido 12/07/2018   Patient did not return after his visit on 11/07/21, we will DC at this time.   PHYSICAL THERAPY DISCHARGE SUMMARY  Visits from Start of Care: 13  Current functional level related to goals / functional outcomes: See above   Remaining deficits: See above   Education / Equipment: See above   Patient agrees to discharge. Patient goals were met. Patient is being discharged due to meeting the stated rehab goals.  Victorino Dike B. Nethra Mehlberg, PT 12/21/225:42 PM   [facility] @ [facility] [address] [place], [place], [address] Phone: [phone]   Fax:  [phone]  Name: Knight Merklin MRN: [id number] Date of Birth: 15-Oct-1986

## 2021-11-13 ENCOUNTER — Ambulatory Visit: Payer: 59

## 2022-06-27 DIAGNOSIS — H52223 Regular astigmatism, bilateral: Secondary | ICD-10-CM | POA: Diagnosis not present

## 2022-06-27 DIAGNOSIS — H5213 Myopia, bilateral: Secondary | ICD-10-CM | POA: Diagnosis not present

## 2022-07-03 DIAGNOSIS — Z0000 Encounter for general adult medical examination without abnormal findings: Secondary | ICD-10-CM | POA: Diagnosis not present

## 2022-07-03 DIAGNOSIS — N529 Male erectile dysfunction, unspecified: Secondary | ICD-10-CM | POA: Diagnosis not present

## 2022-07-03 DIAGNOSIS — R195 Other fecal abnormalities: Secondary | ICD-10-CM | POA: Diagnosis not present

## 2022-07-03 DIAGNOSIS — Z13220 Encounter for screening for lipoid disorders: Secondary | ICD-10-CM | POA: Diagnosis not present

## 2022-07-03 DIAGNOSIS — Z91018 Allergy to other foods: Secondary | ICD-10-CM | POA: Diagnosis not present

## 2022-07-08 ENCOUNTER — Other Ambulatory Visit (HOSPITAL_COMMUNITY): Payer: Self-pay

## 2022-07-08 MED ORDER — EPINEPHRINE 0.3 MG/0.3ML IJ SOAJ
INTRAMUSCULAR | 0 refills | Status: AC
  Filled 2022-07-08: qty 2, 2d supply, fill #0
  Filled 2022-07-08: qty 2, 14d supply, fill #0

## 2022-07-18 DIAGNOSIS — R197 Diarrhea, unspecified: Secondary | ICD-10-CM | POA: Diagnosis not present

## 2022-11-25 IMAGING — MR MR LUMBAR SPINE W/O CM
4 of 5 series · 18 of 48 positions shown · non-contrast
Comparison: Radiograph from 06/06/2021.

CLINICAL DATA: Initial evaluation for low back pain with right leg
pain for several years.

EXAM:
MRI LUMBAR SPINE WITHOUT CONTRAST
TECHNIQUE: Multiplanar, multisequence MR imaging of the lumbar spine was
performed. No intravenous contrast was administered.

[Series 5: T2 · sagittal · 4.0mm · 0.73mm/px · 6 of 15 slices shown (1 of 2)]
[im 1/15]
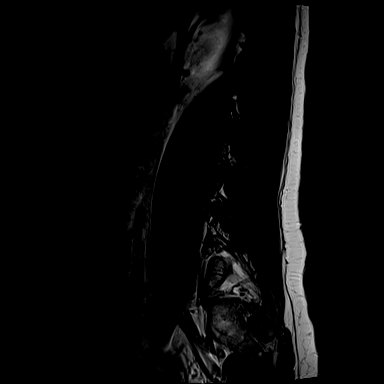
[im 3/15]
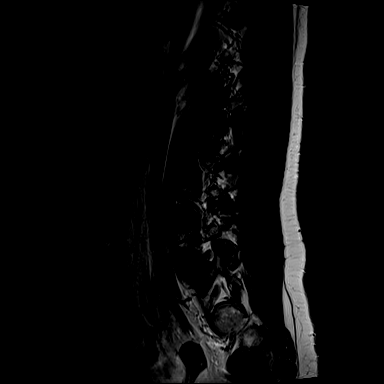
[im 6/15]
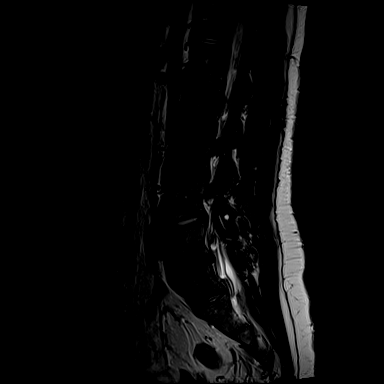
[im 9/15]
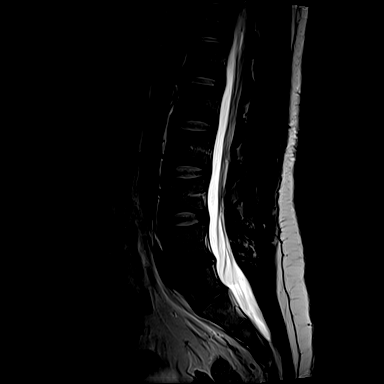
[im 12/15]
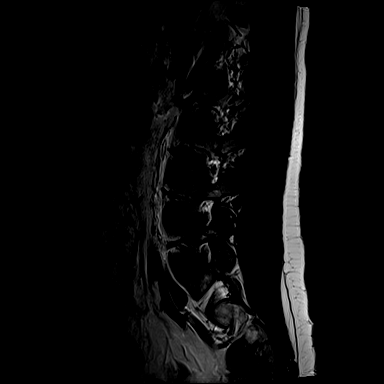
[im 15/15]
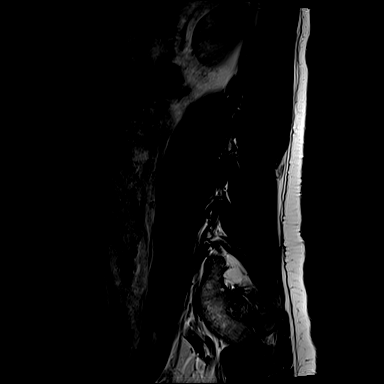

[Series 6: T1 · sagittal · 4.0mm · 0.73mm/px · 3 of 15 slices shown (1 of 2)]
[im 1/15]
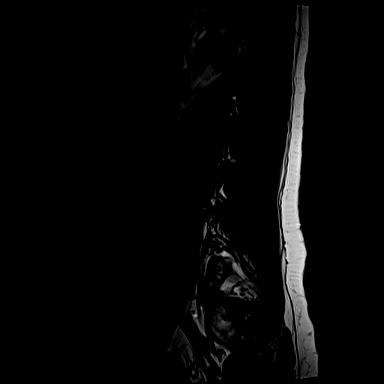
[im 8/15]
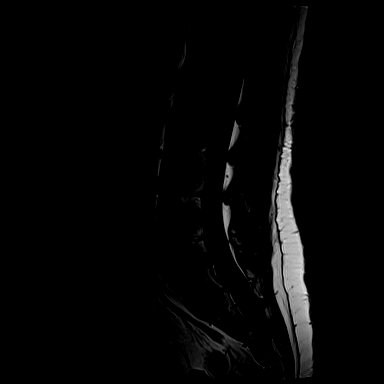
[im 15/15]
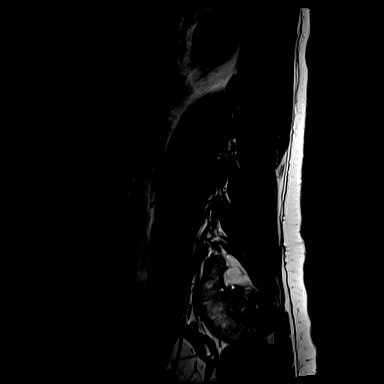

[Series 10: T1 · axial · 4.0mm · 0.28mm/px · z∈[-106,+77]mm · 3 of 45 slices shown (2 of 2)]
[im 6/45]
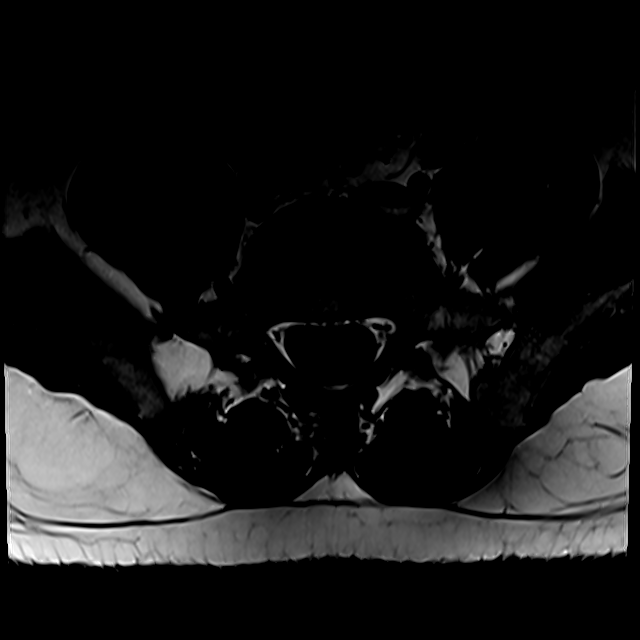
[im 24/45]
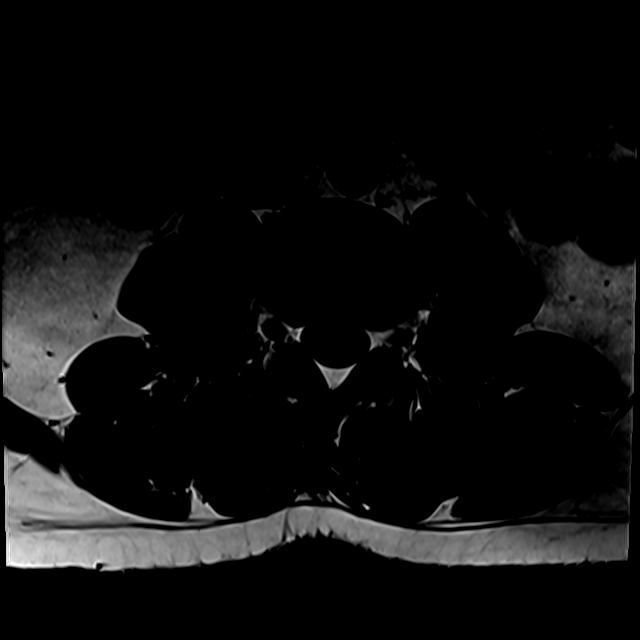
[im 39/45]
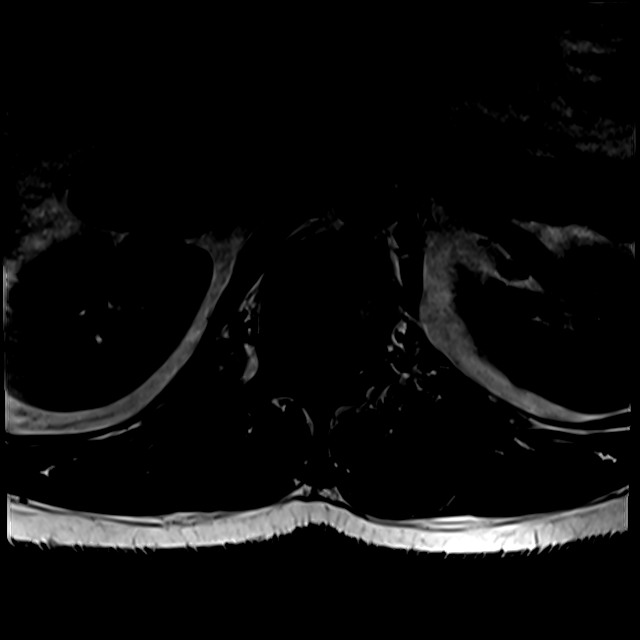

[Series 13: T2 · axial · 4.0mm · 0.28mm/px · z∈[-121,+77]mm · 6 of 45 slices shown (2 of 2)]
[im 3/45]
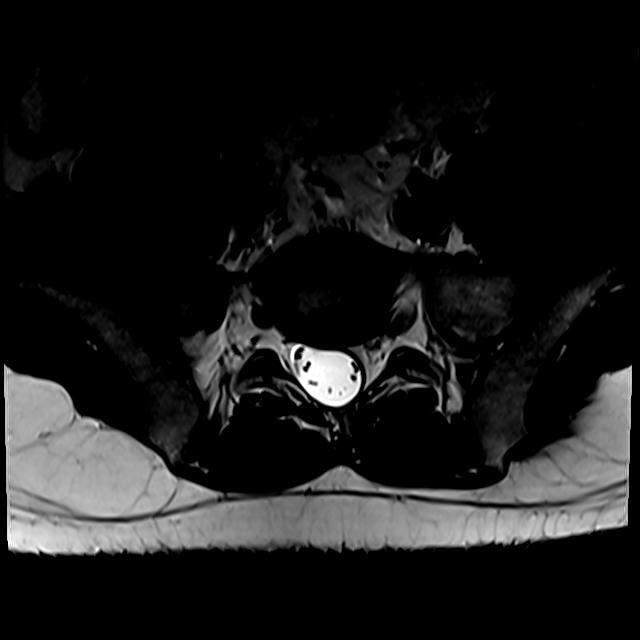
[im 6/45]
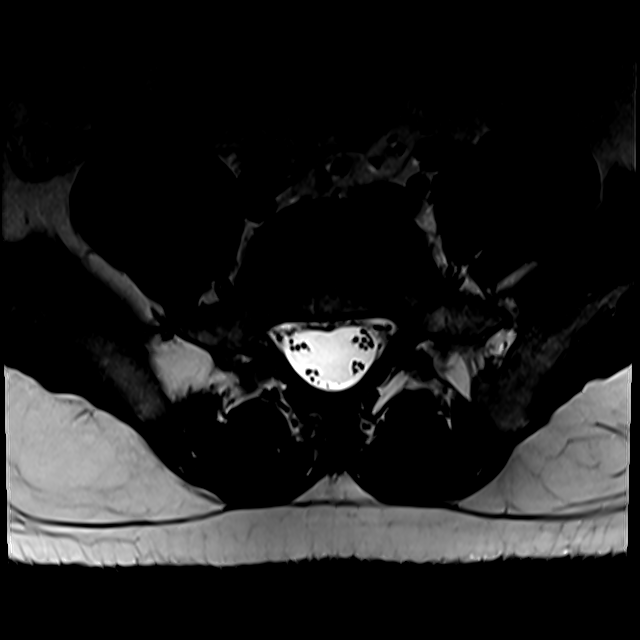
[im 9/45]
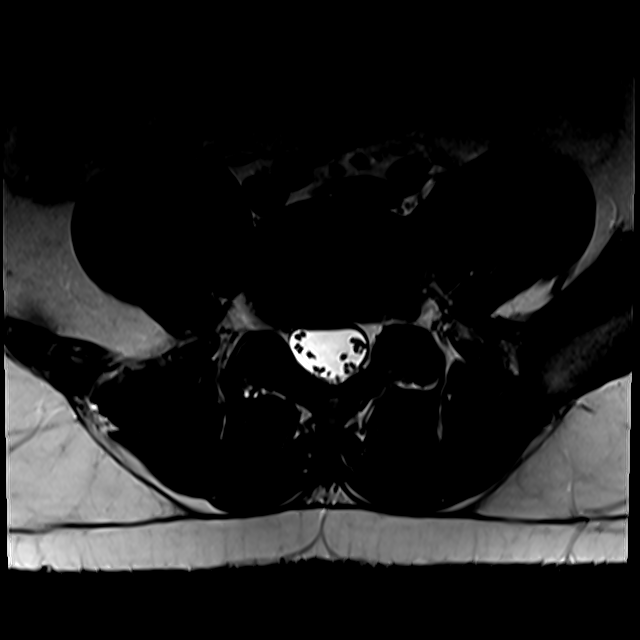
[im 15/45]
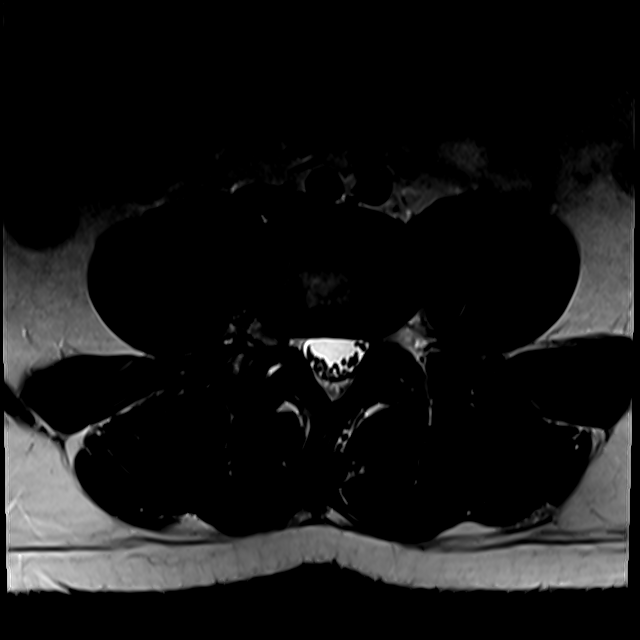
[im 24/45]
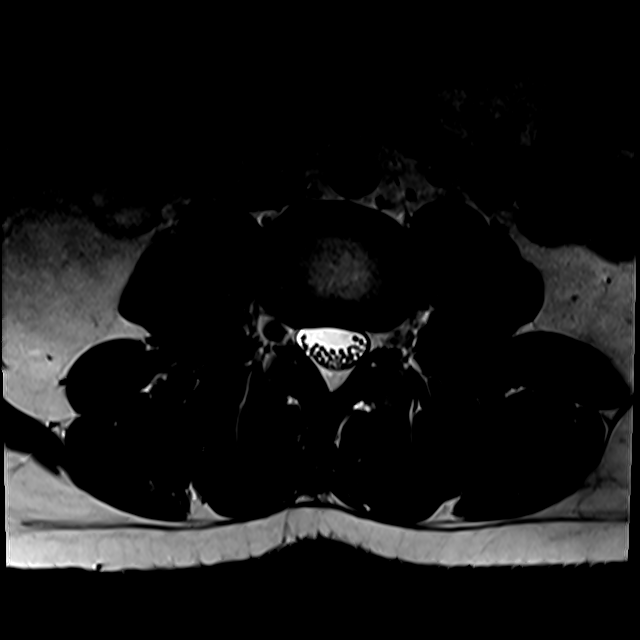
[im 39/45]
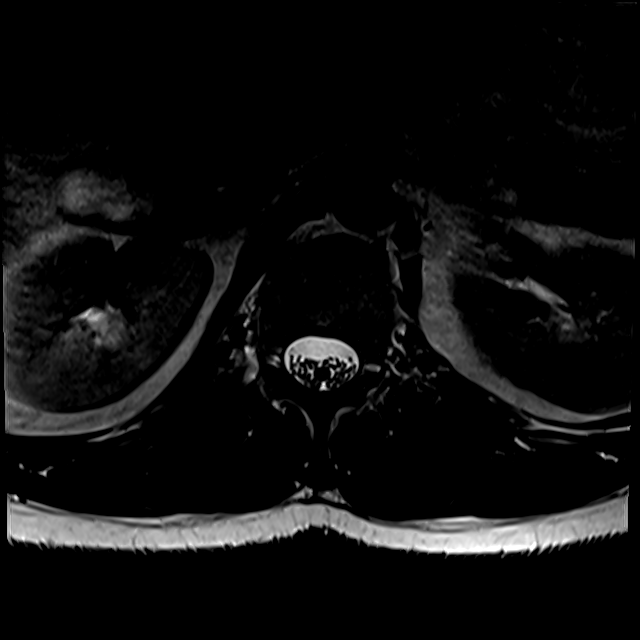

[18 of 48 positions shown; findings below may reference images not displayed]

FINDINGS: Segmentation: Standard. Lowest well-formed disc space labeled the
L5-S1 level.

Alignment: Physiologic with preservation of the normal lumbar
lordosis. No listhesis.

Vertebrae: Vertebral body height maintained without acute or chronic
fracture. Bone marrow signal intensity within normal limits. No
discrete or worrisome osseous lesions. No abnormal marrow edema.

Conus medullaris and cauda equina: Conus extends to the L1 level.
Conus and cauda equina appear normal.

Paraspinal and other soft tissues: Paraspinous soft tissues within
normal limits. Subcentimeter simple cyst noted within the right
hepatic lobe. Visualized visceral structures otherwise unremarkable.

Disc levels:

L1-2:  Unremarkable.

L2-3:  Unremarkable.

L3-4:  Unremarkable.

L4-5: Mild annular disc bulge without focal disc herniation. Mild
facet hypertrophy. No spinal stenosis. Mild bilateral L4 foraminal
narrowing.

L5-S1: Disc desiccation. Superimposed shallow broad-based central
disc protrusion with slight caudad angulation. Associated annular
fissure. There is an additional subtle right foraminal disc
protrusion contacting the exiting right L5 nerve root (series 10,
image 37). Mild bilateral facet hypertrophy. No canal or lateral
recess stenosis. Mild bilateral L5 foraminal narrowing.
IMPRESSION: 1. Small right foraminal disc protrusion at L5-S1, contacting and
potentially irritating the exiting right L5 nerve root.
2. Additional shallow central disc protrusion at L5-S1 without
impingement.
3. Mild bilateral L4 and L5 foraminal stenosis related to disc
bulging and facet hypertrophy.

## 2022-11-25 IMAGING — MR MR KNEE*R* W/O CM
6 series · 40 of 40 positions shown · non-contrast
Comparison: 06/06/2021 radiographs

CLINICAL DATA: Chronic right knee pain, remote injury

EXAM:
MRI OF THE RIGHT KNEE WITHOUT CONTRAST
TECHNIQUE: Multiplanar, multisequence MR imaging of the knee was performed. No
intravenous contrast was administered.

[Series 6: T2 fat-sat · axial · right · 4.0mm · 0.50mm/px · z∈[-59,+94]mm · 9 of 36 slices shown (1 of 3)]
[im 1/36]
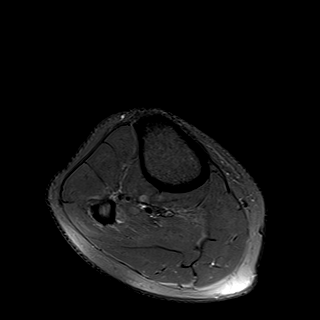
[im 5/36]
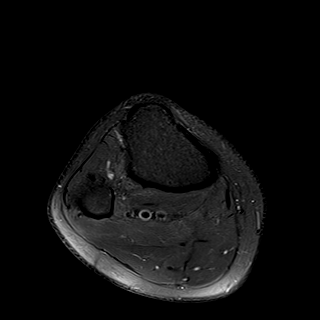
[im 9/36]
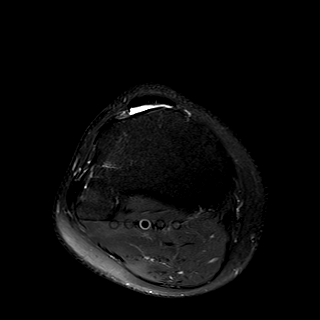
[im 14/36]
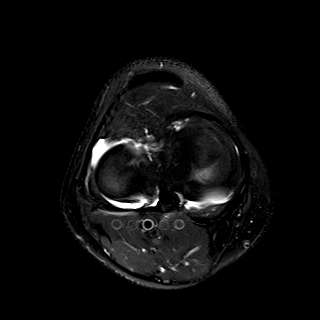
[im 18/36]
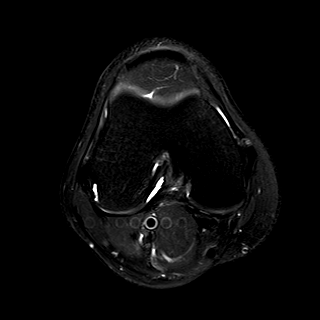
[im 22/36]
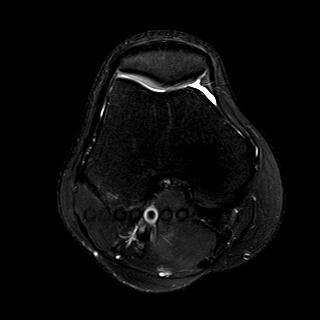
[im 27/36]
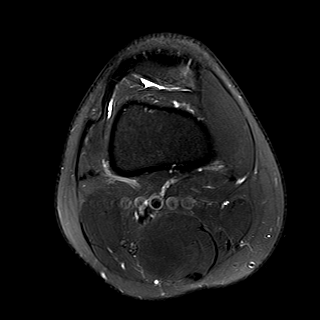
[im 31/36]
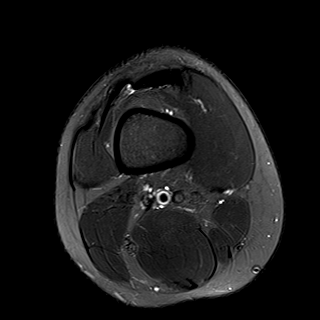
[im 36/36]
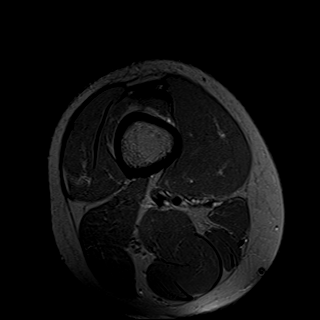

[Series 7: T2 fat-sat · coronal · right · 4.0mm · 0.47mm/px · 6 of 28 slices shown (2 of 3)]
[im 1/28]
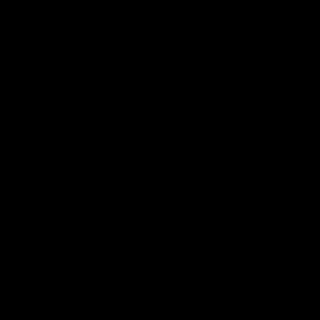
[im 6/28]
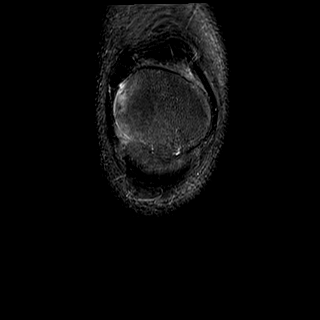
[im 11/28]
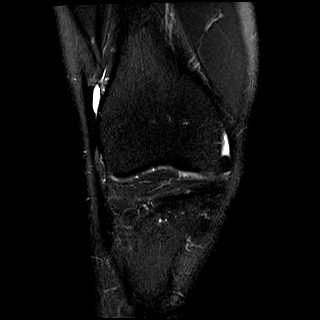
[im 17/28]
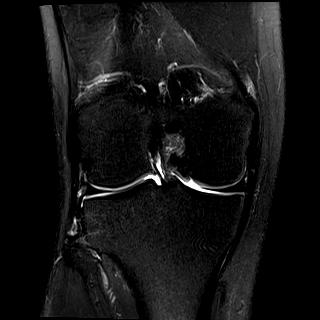
[im 22/28]
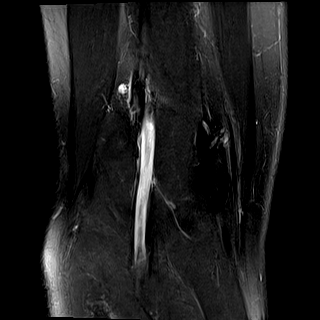
[im 28/28]
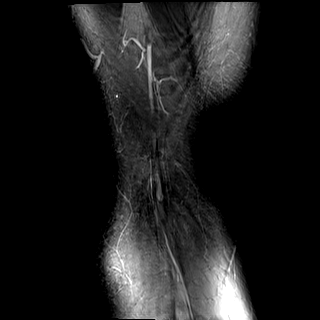

[Series 8: T1 · coronal · right · 4.0mm · 0.47mm/px · 6 of 28 slices shown]
[im 1/28]
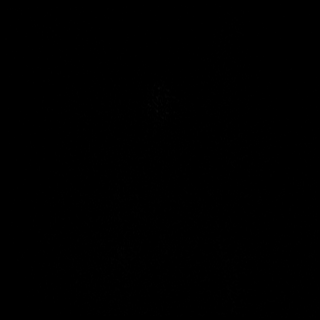
[im 6/28]
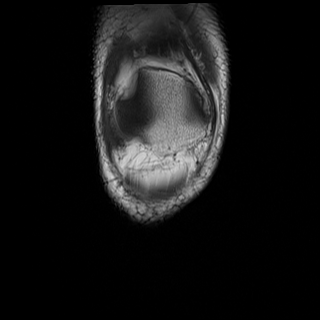
[im 11/28]
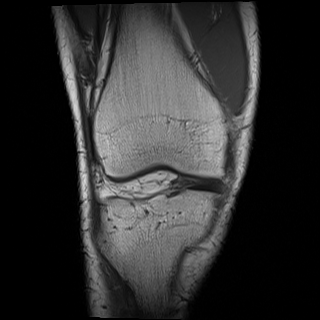
[im 17/28]
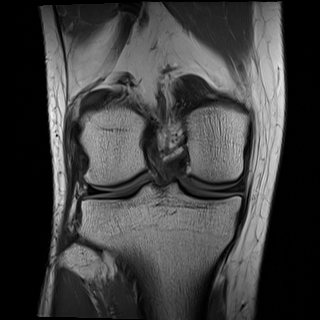
[im 22/28]
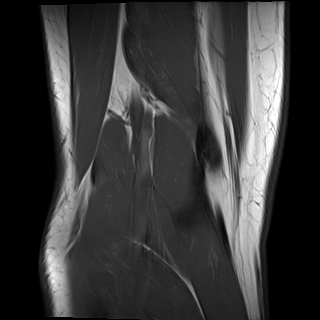
[im 28/28]
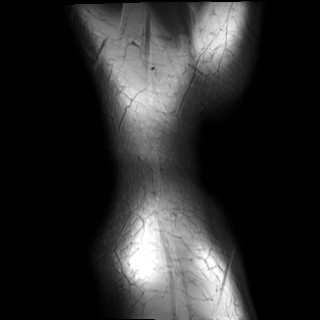

[Series 9: PD fat-sat · coronal · right · 3.0mm · 0.47mm/px · 7 of 31 slices shown (1 of 2)]
[im 1/31]
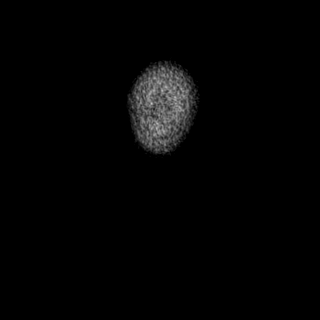
[im 6/31]
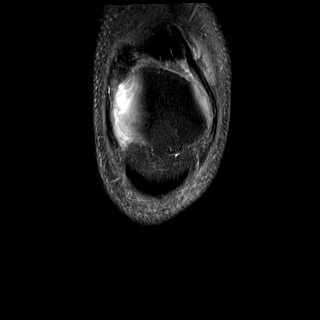
[im 11/31]
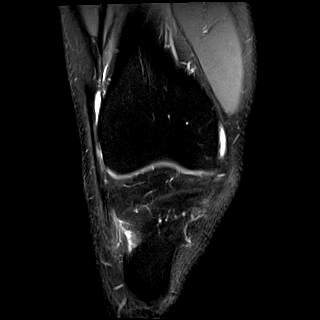
[im 16/31]
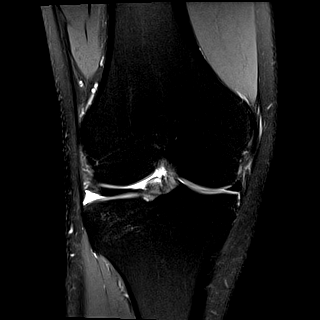
[im 21/31]
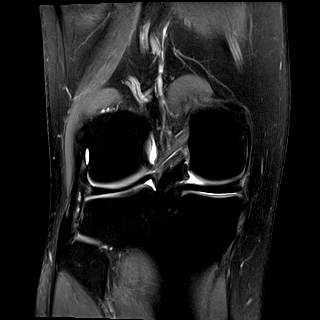
[im 26/31]
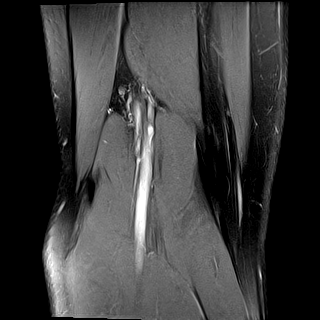
[im 31/31]
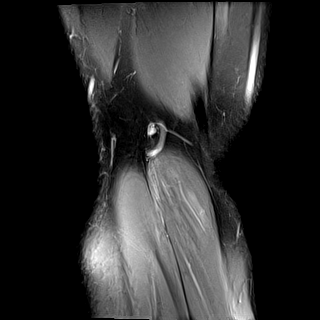

[Series 10: PD fat-sat · sagittal · right · 3.0mm · 0.39mm/px · 6 of 27 slices shown (2 of 2)]
[im 1/27]
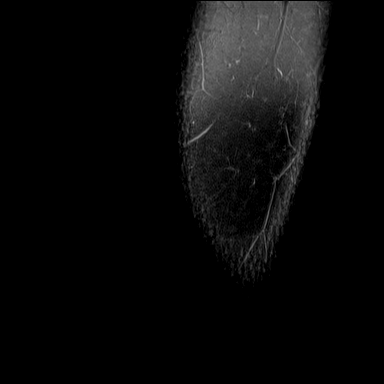
[im 6/27]
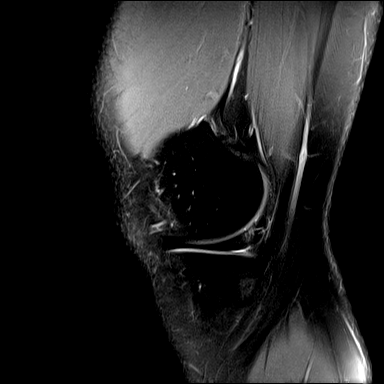
[im 11/27]
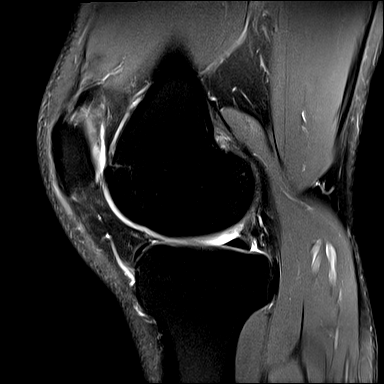
[im 16/27]
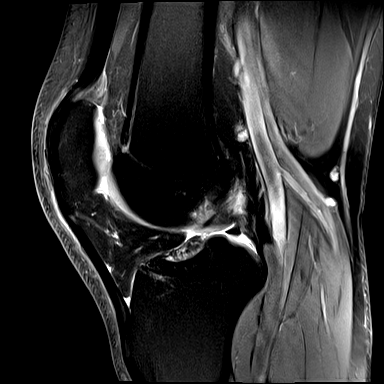
[im 21/27]
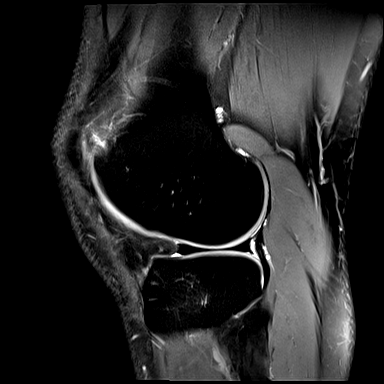
[im 27/27]
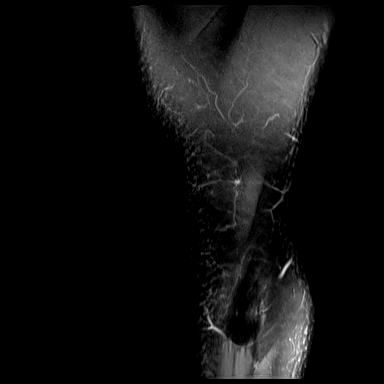

[Series 11: T2 fat-sat · sagittal · right · 3.0mm · 0.39mm/px · 6 of 27 slices shown (3 of 3)]
[im 1/27]
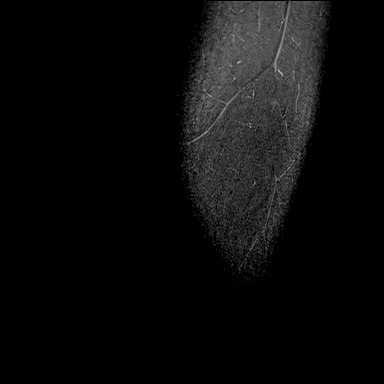
[im 6/27]
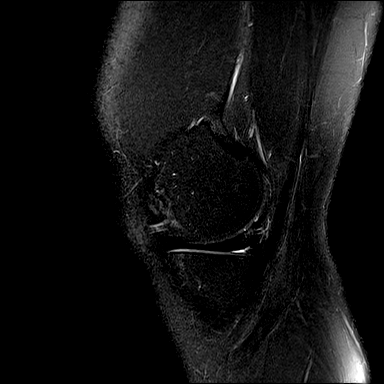
[im 11/27]
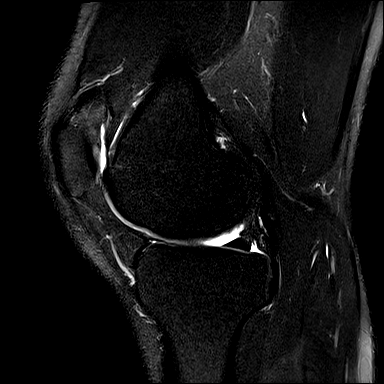
[im 16/27]
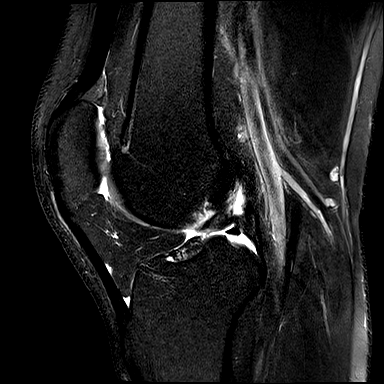
[im 21/27]
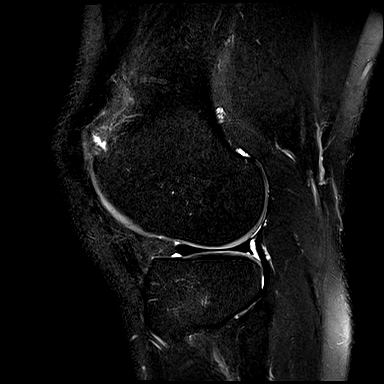
[im 27/27]
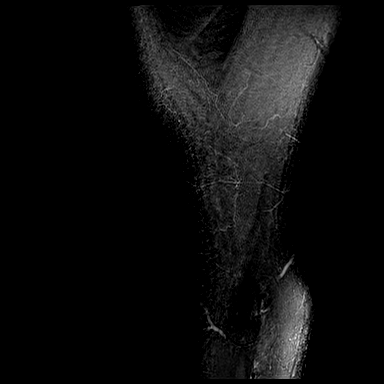

[40 of 40 positions shown; findings below may reference images not displayed]

FINDINGS: MENISCI

Medial meniscus:  Unremarkable

Lateral meniscus:  Unremarkable

LIGAMENTS

Cruciates: Mildly accentuated signal in the distal ACL may reflect
degeneration or mild sprain. No overt tear is identified. The PCL
appears intact.

Collaterals:  Unremarkable

CARTILAGE

Patellofemoral: Mild focal chondral edema along the medial patellar
facet extending towards the posterior patellar apex on images 13
through 14 of series 6.

Medial:  Unremarkable

Lateral:  Unremarkable

Joint: Mild focal synovitis just above the patella and posterior to
the distal quadriceps tendon, image 14 series 10. No overt joint
effusion.

Popliteal Fossa:  Unremarkable

Extensor Mechanism:  Unremarkable

Bones: No significant extra-articular osseous abnormalities
identified.

Other:

No supplemental non-categorized findings.
IMPRESSION: 1. No medial meniscal tear is identified.
2. Accentuated signal in the distal ACL may reflect degeneration or
mild sprain, but no overt tear is identified.
3. Subtle focal chondral edema along the medial patellar facet,
without overt chondral defect or thinning.
4. Mild focal synovitis just above the patella.  No knee effusion.

## 2022-11-29 ENCOUNTER — Other Ambulatory Visit (HOSPITAL_COMMUNITY): Payer: Self-pay

## 2023-02-17 ENCOUNTER — Encounter (HOSPITAL_BASED_OUTPATIENT_CLINIC_OR_DEPARTMENT_OTHER): Payer: Self-pay | Admitting: Student

## 2023-02-17 ENCOUNTER — Ambulatory Visit (INDEPENDENT_AMBULATORY_CARE_PROVIDER_SITE_OTHER): Payer: Commercial Managed Care - PPO

## 2023-02-17 ENCOUNTER — Ambulatory Visit (INDEPENDENT_AMBULATORY_CARE_PROVIDER_SITE_OTHER): Payer: Commercial Managed Care - PPO | Admitting: Student

## 2023-02-17 DIAGNOSIS — M79644 Pain in right finger(s): Secondary | ICD-10-CM | POA: Diagnosis not present

## 2023-02-17 DIAGNOSIS — M25532 Pain in left wrist: Secondary | ICD-10-CM

## 2023-02-17 DIAGNOSIS — S62201A Unspecified fracture of first metacarpal bone, right hand, initial encounter for closed fracture: Secondary | ICD-10-CM | POA: Diagnosis not present

## 2023-02-17 NOTE — Progress Notes (Signed)
Chief Complaint: Right thumb pain     History of Present Illness:    Manuel Barnes is a 37 y.o. male presenting for evaluation of right thumb pain after an injury he sustained last Tuesday.  He states that while playing soccer, he fell forward and while trying to brace himself he believes that his thumb "went backward".  Since then, it has been very painful he has noticed swelling and bruising in the area.  He has tried wrapping it as well as ice and Tylenol which have been of little benefit.  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   Surgical History:   None  PMH/PSH/Family History/Social History/Meds/Allergies:   History reviewed. No pertinent past medical history. History reviewed. No pertinent surgical history. Social History   Socioeconomic History   Marital status: Married    Spouse name: Not on file   Number of children: Not on file   Years of education: Not on file   Highest education level: Not on file  Occupational History   Not on file  Tobacco Use   Smoking status: Never   Smokeless tobacco: Never  Substance and Sexual Activity   Alcohol use: Not on file   Drug use: Not on file   Sexual activity: Not on file  Other Topics Concern   Not on file  Social History Narrative   Not on file   Social Determinants of Health   Financial Resource Strain: Not on file  Food Insecurity: Not on file  Transportation Needs: Not on file  Physical Activity: Not on file  Stress: Not on file  Social Connections: Not on file   Family History  Problem Relation Age of Onset   Diabetes Father    Hyperlipidemia Father    Diabetes Paternal Uncle    Hyperlipidemia Paternal Uncle    Diabetes Paternal Grandmother    Hyperlipidemia Paternal Grandmother    Diabetes Paternal Grandfather    Hyperlipidemia Paternal Grandfather    Stroke Neg Hx    No Known  Allergies Current Outpatient Medications  Medication Sig Dispense Refill   EPINEPHrine (EPIPEN 2-PAK) 0.3 mg/0.3 mL IJ SOAJ injection Use as directed 2 each 0   No current facility-administered medications for this visit.   No results found.  Review of Systems:   A ROS was performed including pertinent positives and negatives as documented in the HPI.  Physical Exam :   Constitutional: NAD and appears stated age Neurological: Alert and oriented Psych: Appropriate affect and cooperative There were no vitals taken for this visit.   Comprehensive Musculoskeletal Exam:    Significant soft tissue edema and bruising around the base of the right thumb.  Overall motion at the MCP joint is limited.  Flexion and extension of the DIP is is normal with 5/5 strength.  Radial pulse 2+.  Distal sensation to the thumb and other digits is equal and intact.  Imaging:   Xray (right thumb 3 views): Distal fracture of 1st metacarpal at the MCP joint, possible avulsion. No evidence of dislocation.  I personally reviewed and interpreted the radiographs.   Assessment:   37 y.o. male presenting with right thumb pain after a hyperextension injury.  X-ray does show an avulsion fracture at the first MCP  joint.  We will put him in a thumb immobilizing brace today.  I also recommend that he be seen by Dr. Greta Doom for further evaluation and to discuss treatment options.  Advised that he can continue with Tylenol or ibuprofen for pain and can continue to ice.  Plan :    -Thumb immobilization and referral to hand specialist     I personally saw and evaluated the patient, and participated in the management and treatment plan.  Marnee Spring, PA-C Orthopedics  This document was dictated using Systems analyst. A reasonable attempt at proof reading has been made to minimize errors.

## 2023-02-18 ENCOUNTER — Encounter (HOSPITAL_BASED_OUTPATIENT_CLINIC_OR_DEPARTMENT_OTHER): Payer: Self-pay

## 2023-02-19 NOTE — Telephone Encounter (Signed)
Does this need to go to Riya Huxford B.?

## 2023-02-19 NOTE — Telephone Encounter (Signed)
Sorry about the confusion, but this is for you.   Thank you

## 2023-02-24 DIAGNOSIS — M79644 Pain in right finger(s): Secondary | ICD-10-CM | POA: Diagnosis not present

## 2023-02-24 DIAGNOSIS — M79641 Pain in right hand: Secondary | ICD-10-CM | POA: Diagnosis not present

## 2023-02-26 DIAGNOSIS — M79641 Pain in right hand: Secondary | ICD-10-CM | POA: Diagnosis not present

## 2023-02-27 DIAGNOSIS — S63641D Sprain of metacarpophalangeal joint of right thumb, subsequent encounter: Secondary | ICD-10-CM | POA: Diagnosis not present

## 2023-03-07 ENCOUNTER — Other Ambulatory Visit (HOSPITAL_COMMUNITY): Payer: Self-pay

## 2023-03-07 DIAGNOSIS — S63641A Sprain of metacarpophalangeal joint of right thumb, initial encounter: Secondary | ICD-10-CM | POA: Diagnosis not present

## 2023-03-07 DIAGNOSIS — G8918 Other acute postprocedural pain: Secondary | ICD-10-CM | POA: Diagnosis not present

## 2023-03-07 DIAGNOSIS — X58XXXA Exposure to other specified factors, initial encounter: Secondary | ICD-10-CM | POA: Diagnosis not present

## 2023-03-07 DIAGNOSIS — Y999 Unspecified external cause status: Secondary | ICD-10-CM | POA: Diagnosis not present

## 2023-03-07 MED ORDER — HYDROCODONE-ACETAMINOPHEN 5-325 MG PO TABS
1.0000 | ORAL_TABLET | Freq: Four times a day (QID) | ORAL | 0 refills | Status: DC
  Filled 2023-03-07: qty 20, 5d supply, fill #0

## 2023-03-20 DIAGNOSIS — M79644 Pain in right finger(s): Secondary | ICD-10-CM | POA: Diagnosis not present

## 2023-03-28 DIAGNOSIS — M25641 Stiffness of right hand, not elsewhere classified: Secondary | ICD-10-CM | POA: Diagnosis not present

## 2023-04-04 DIAGNOSIS — M25641 Stiffness of right hand, not elsewhere classified: Secondary | ICD-10-CM | POA: Diagnosis not present

## 2023-04-11 DIAGNOSIS — M25641 Stiffness of right hand, not elsewhere classified: Secondary | ICD-10-CM | POA: Diagnosis not present

## 2023-04-18 DIAGNOSIS — M25641 Stiffness of right hand, not elsewhere classified: Secondary | ICD-10-CM | POA: Diagnosis not present

## 2023-04-25 DIAGNOSIS — M25641 Stiffness of right hand, not elsewhere classified: Secondary | ICD-10-CM | POA: Diagnosis not present

## 2023-05-02 DIAGNOSIS — M25641 Stiffness of right hand, not elsewhere classified: Secondary | ICD-10-CM | POA: Diagnosis not present

## 2023-05-09 DIAGNOSIS — M25641 Stiffness of right hand, not elsewhere classified: Secondary | ICD-10-CM | POA: Diagnosis not present

## 2023-05-09 DIAGNOSIS — M79644 Pain in right finger(s): Secondary | ICD-10-CM | POA: Diagnosis not present

## 2023-05-16 DIAGNOSIS — M79641 Pain in right hand: Secondary | ICD-10-CM | POA: Diagnosis not present

## 2023-05-30 DIAGNOSIS — M25641 Stiffness of right hand, not elsewhere classified: Secondary | ICD-10-CM | POA: Diagnosis not present

## 2023-07-09 DIAGNOSIS — E782 Mixed hyperlipidemia: Secondary | ICD-10-CM | POA: Diagnosis not present

## 2023-07-09 DIAGNOSIS — Z91018 Allergy to other foods: Secondary | ICD-10-CM | POA: Diagnosis not present

## 2023-07-09 DIAGNOSIS — K219 Gastro-esophageal reflux disease without esophagitis: Secondary | ICD-10-CM | POA: Diagnosis not present

## 2023-07-09 DIAGNOSIS — Z0000 Encounter for general adult medical examination without abnormal findings: Secondary | ICD-10-CM | POA: Diagnosis not present

## 2024-02-17 DIAGNOSIS — H5213 Myopia, bilateral: Secondary | ICD-10-CM | POA: Diagnosis not present

## 2024-02-17 DIAGNOSIS — H52223 Regular astigmatism, bilateral: Secondary | ICD-10-CM | POA: Diagnosis not present

## 2024-07-26 DIAGNOSIS — Z91018 Allergy to other foods: Secondary | ICD-10-CM | POA: Diagnosis not present

## 2024-07-26 DIAGNOSIS — E7800 Pure hypercholesterolemia, unspecified: Secondary | ICD-10-CM | POA: Diagnosis not present

## 2024-07-26 DIAGNOSIS — Z0000 Encounter for general adult medical examination without abnormal findings: Secondary | ICD-10-CM | POA: Diagnosis not present
